# Patient Record
Sex: Female | Born: 1935 | Race: White | Hispanic: No | Smoking: Never smoker
Health system: Southern US, Community
[De-identification: ages and names within clinical notes are randomized; demographics above are authoritative.]

## PROBLEM LIST (undated history)

## (undated) DIAGNOSIS — I2510 Atherosclerotic heart disease of native coronary artery without angina pectoris: Secondary | ICD-10-CM

## (undated) DIAGNOSIS — R002 Palpitations: Secondary | ICD-10-CM

## (undated) DIAGNOSIS — E785 Hyperlipidemia, unspecified: Secondary | ICD-10-CM

## (undated) HISTORY — DX: Palpitations: R00.2

## (undated) HISTORY — PX: TONSILLECTOMY: SUR1361

## (undated) HISTORY — PX: VESICOVAGINAL FISTULA CLOSURE W/ TAH: SUR271

## (undated) HISTORY — PX: LITHOTRIPSY: SUR834

## (undated) HISTORY — DX: Hyperlipidemia, unspecified: E78.5

## (undated) HISTORY — DX: Atherosclerotic heart disease of native coronary artery without angina pectoris: I25.10

---

## 2004-10-28 ENCOUNTER — Ambulatory Visit: Payer: Self-pay | Admitting: Cardiology

## 2004-11-07 ENCOUNTER — Ambulatory Visit: Payer: Self-pay

## 2004-11-07 ENCOUNTER — Ambulatory Visit: Payer: Self-pay | Admitting: Cardiology

## 2004-11-28 ENCOUNTER — Ambulatory Visit: Payer: Self-pay | Admitting: Cardiology

## 2005-01-15 ENCOUNTER — Ambulatory Visit: Payer: Self-pay | Admitting: Cardiology

## 2005-06-10 ENCOUNTER — Ambulatory Visit: Payer: Self-pay | Admitting: Cardiology

## 2005-11-19 ENCOUNTER — Ambulatory Visit: Payer: Self-pay | Admitting: Cardiology

## 2006-04-06 ENCOUNTER — Ambulatory Visit: Payer: Self-pay | Admitting: Ophthalmology

## 2006-04-13 ENCOUNTER — Ambulatory Visit: Payer: Self-pay | Admitting: Ophthalmology

## 2006-06-03 ENCOUNTER — Inpatient Hospital Stay: Payer: Self-pay | Admitting: Urology

## 2006-06-24 ENCOUNTER — Ambulatory Visit: Payer: Self-pay | Admitting: Cardiology

## 2006-06-24 LAB — CONVERTED CEMR LAB
AST: 19 units/L (ref 0–37)
Albumin: 3.9 g/dL (ref 3.5–5.2)
Bilirubin, Direct: 0.1 mg/dL (ref 0.0–0.3)
LDL Cholesterol: 73 mg/dL (ref 0–99)
Total Bilirubin: 1 mg/dL (ref 0.3–1.2)
Total Protein: 6.5 g/dL (ref 6.0–8.3)
VLDL: 23 mg/dL (ref 0–40)

## 2006-07-06 ENCOUNTER — Observation Stay (HOSPITAL_COMMUNITY): Admission: EM | Admit: 2006-07-06 | Discharge: 2006-07-07 | Payer: Self-pay | Admitting: Emergency Medicine

## 2006-07-06 ENCOUNTER — Ambulatory Visit: Payer: Self-pay | Admitting: Cardiovascular Disease

## 2006-07-07 ENCOUNTER — Ambulatory Visit: Payer: Self-pay

## 2006-07-09 ENCOUNTER — Ambulatory Visit: Payer: Self-pay | Admitting: Urology

## 2006-07-16 ENCOUNTER — Ambulatory Visit: Payer: Self-pay | Admitting: Urology

## 2006-10-20 ENCOUNTER — Ambulatory Visit: Payer: Self-pay

## 2006-11-23 ENCOUNTER — Ambulatory Visit: Payer: Self-pay | Admitting: Cardiology

## 2007-08-02 ENCOUNTER — Ambulatory Visit: Payer: Self-pay | Admitting: Cardiology

## 2007-08-02 LAB — CONVERTED CEMR LAB
AST: 17 units/L (ref 0–37)
Albumin: 4.5 g/dL (ref 3.5–5.2)
Alkaline Phosphatase: 54 units/L (ref 39–117)
Bilirubin, Direct: 0.1 mg/dL (ref 0.0–0.3)
Total Bilirubin: 0.6 mg/dL (ref 0.3–1.2)
Triglycerides: 139 mg/dL (ref <150)
VLDL: 28 mg/dL (ref 0–40)

## 2007-08-03 ENCOUNTER — Ambulatory Visit: Payer: Self-pay | Admitting: Cardiology

## 2007-09-29 ENCOUNTER — Ambulatory Visit: Payer: Self-pay | Admitting: Unknown Physician Specialty

## 2007-10-13 ENCOUNTER — Ambulatory Visit: Payer: Self-pay

## 2007-11-08 ENCOUNTER — Ambulatory Visit: Payer: Self-pay | Admitting: Cardiology

## 2008-08-29 ENCOUNTER — Ambulatory Visit: Payer: Self-pay | Admitting: Cardiology

## 2008-10-05 IMAGING — CT CT STONE STUDY
1 of 2 series · 15 of 32 positions shown, 19 images · non-contrast
Comparison: none

REASON FOR EXAM: abdominal pain, rm 7
COMMENTS:

[Series 2: stone · axial · 0.61mm/px · z∈[-430,-52]mm · 15 of 143 slices shown, 19 images]
[im 11/143  soft-tissue]
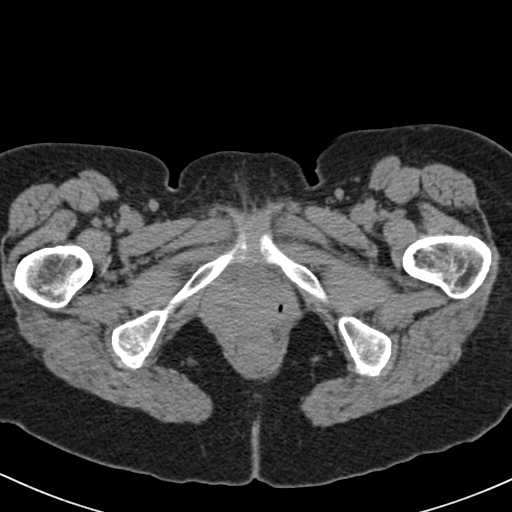
[im 11/143  bone]
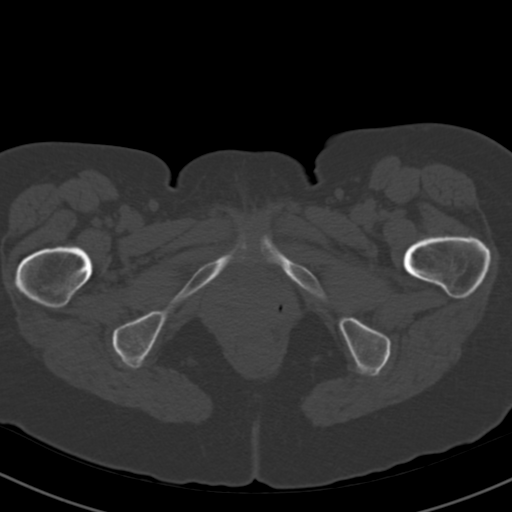
[im 21/143  soft-tissue]
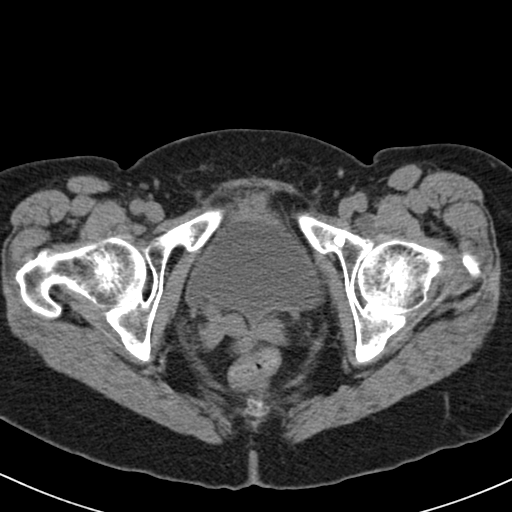
[im 31/143  soft-tissue]
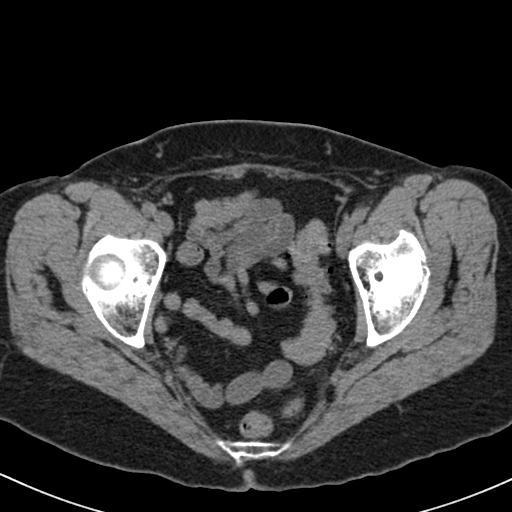
[im 41/143  soft-tissue]
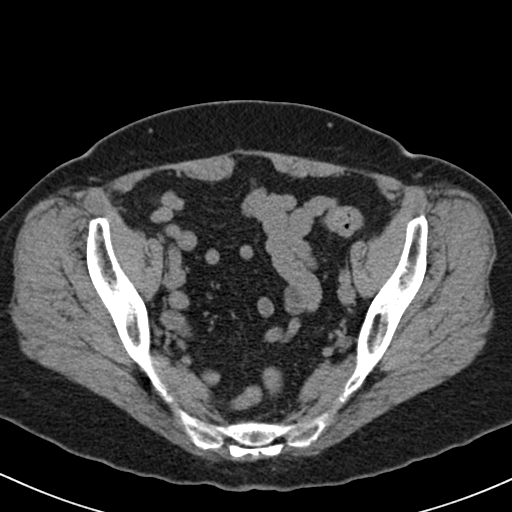
[im 51/143  soft-tissue]
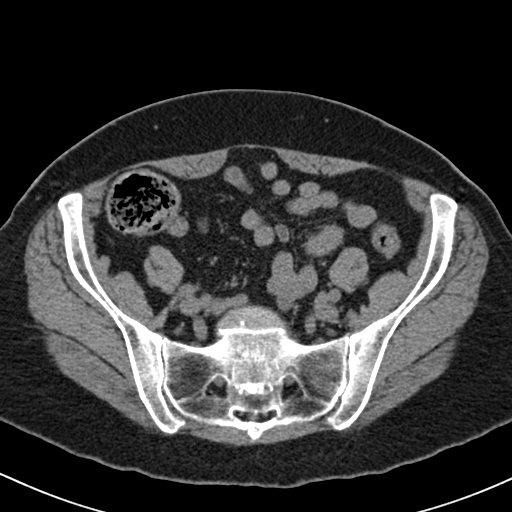
[im 61/143  soft-tissue]
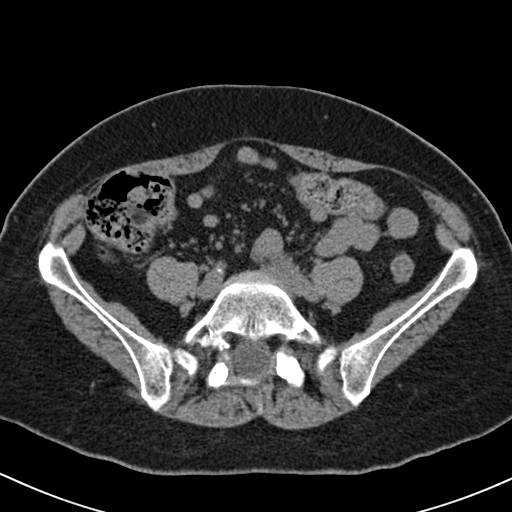
[im 72/143  soft-tissue]
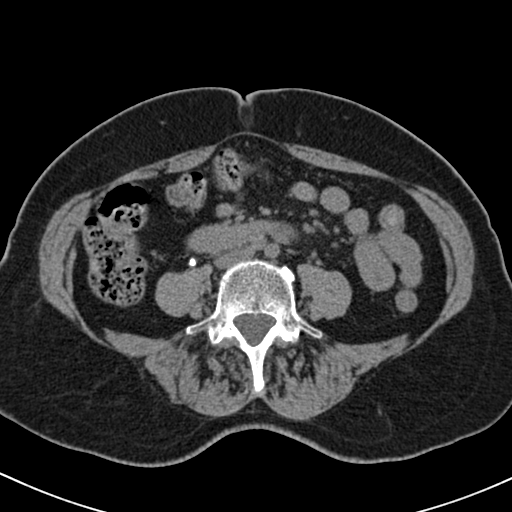
[im 82/143  soft-tissue]
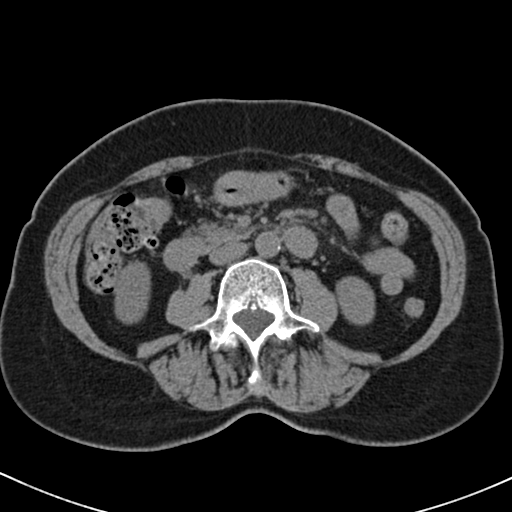
[im 92/143  soft-tissue]
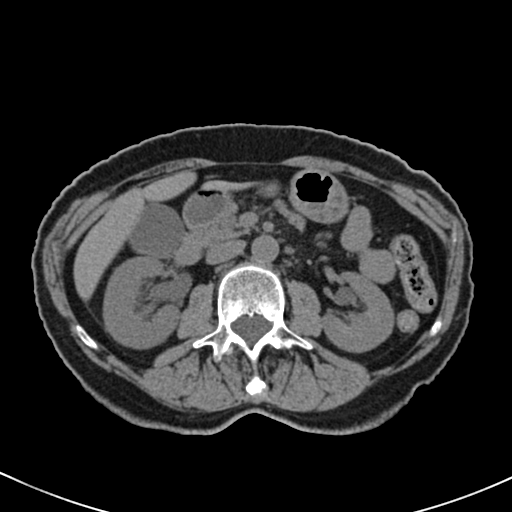
[im 92/143  bone]
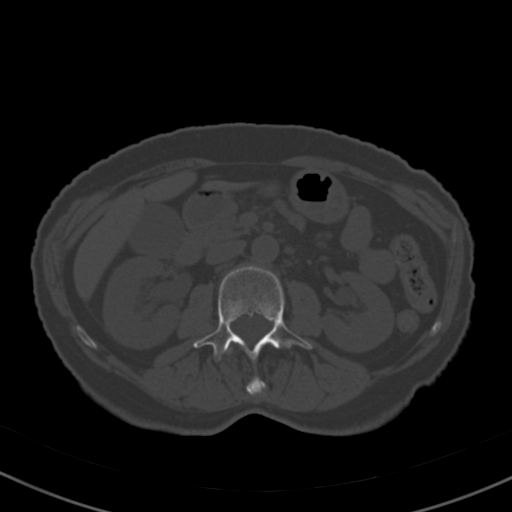
[im 102/143  soft-tissue]
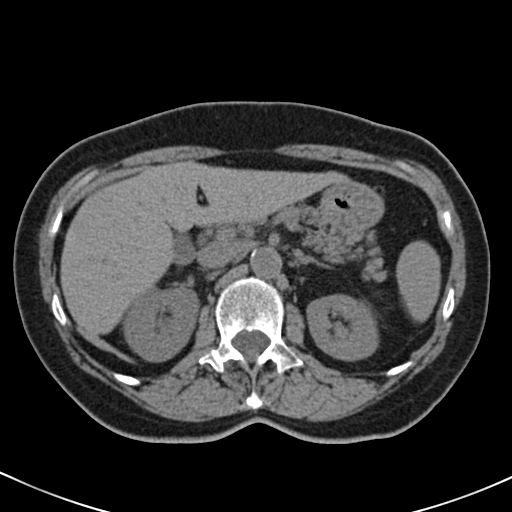
[im 112/143  soft-tissue]
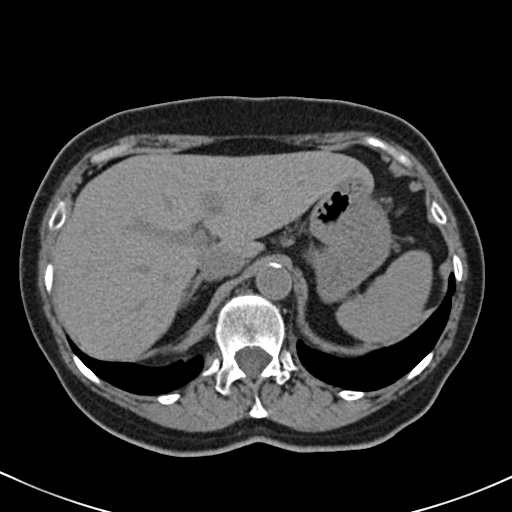
[im 122/143  soft-tissue]
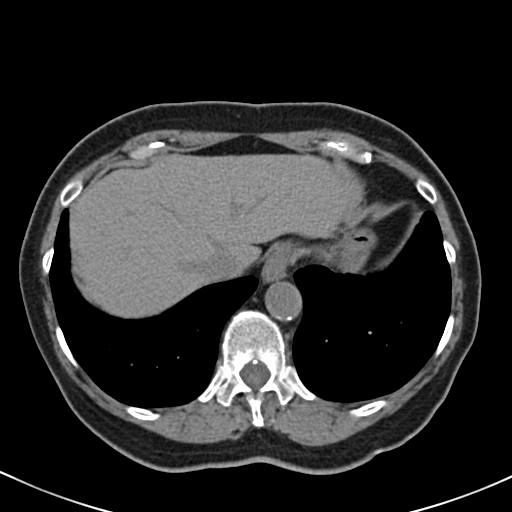
[im 122/143  lung]
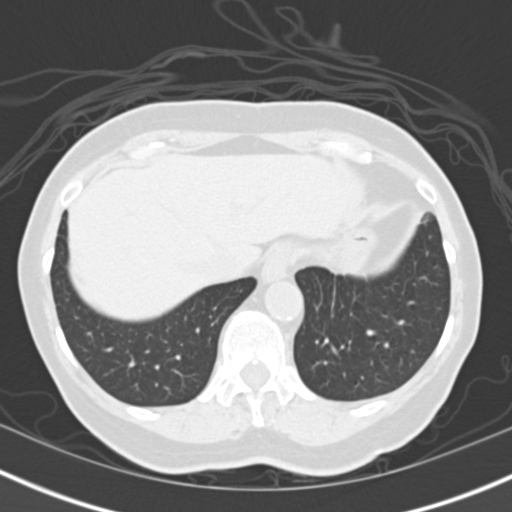
[im 127/143  lung]
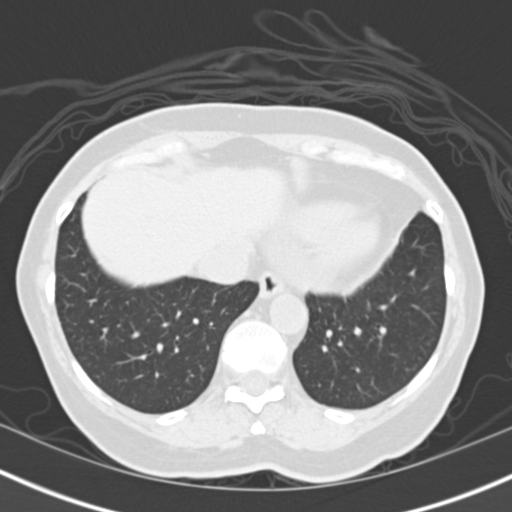
[im 132/143  soft-tissue]
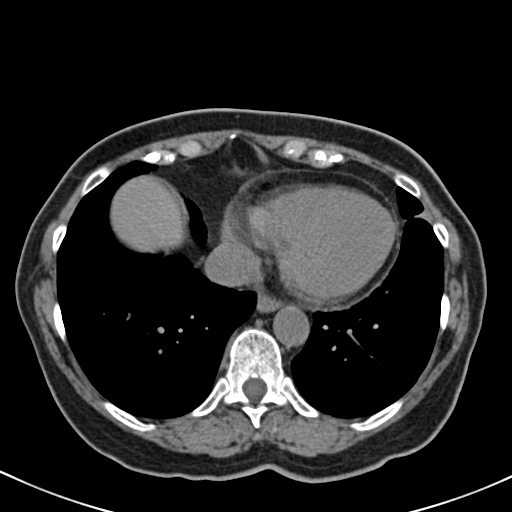
[im 132/143  lung]
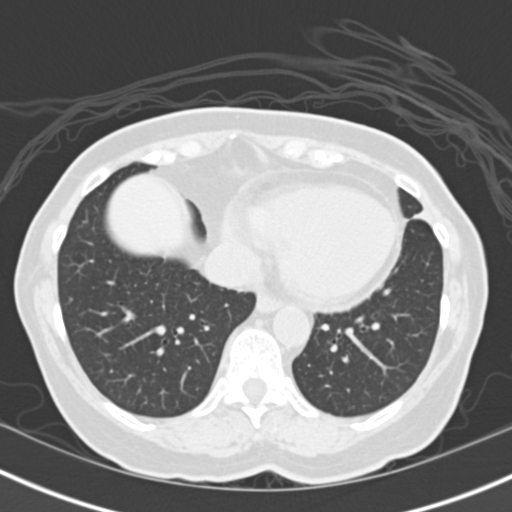
[im 137/143  lung]
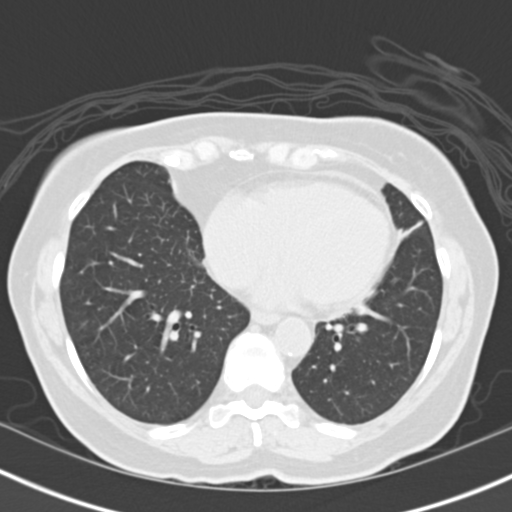

[15 of 32 positions shown; findings below may reference images not displayed]

PROCEDURE:     CT  - CT ABDOMEN /PELVIS WO (STONE)  - June 03, 2006  [DATE]

RESULT:     The patient is complaining of nausea and RIGHT flank discomfort.

There is mild hydronephrosis and hydroureter on the RIGHT secondary to an
approximately 4 mm diameter stone in region of the junction of the proximal
middle thirds of the RIGHT ureter. There is no evidence of a urinary bladder
stone.  There is an additional approximately 4 mm diameter stone in a lower
pole calyx on the RIGHT and sub mm stones are seen in other calices on the
RIGHT. On the LEFT no stone is identified though faint calcification is seen
in the medullary portions of the kidney. The perinephric fat planes are
preserved.  The caliber of the abdominal aorta is normal.  The unopacified
loops of small and large bowel exhibit no acute abnormality.  The liver is
normal in density with no evidence of intrahepatic ductal dilation.  The
gallbladder is adequately distended with no evidence of calcified stones.
The spleen, stomach, and adrenal glands and pancreas exhibit no acute
abnormality.  Within the pelvis the uterus is apparently surgically absent.
I see no adnexal masses nor free fluid.  A few sigmoid diverticula are
evident, but no diverticulitis type changes are seen. The lung bases exhibit
no acute abnormality.
IMPRESSION: 1)There is mild hydronephrosis and hydroureter on the RIGHT secondary to an
approximately 4 to 5 mm diameter stone in the proximal to mid RIGHT ureter.
Other non-obstructing stones are noted on the RIGHT.  I see no definite
stones on the LEFT.

2)I see no acute abnormality elsewhere within the abdomen or pelvis.

A preliminary report was sent to the Emergency Room at the conclusion of the
study.

## 2008-11-14 ENCOUNTER — Emergency Department: Payer: Self-pay | Admitting: Emergency Medicine

## 2008-12-20 ENCOUNTER — Encounter (INDEPENDENT_AMBULATORY_CARE_PROVIDER_SITE_OTHER): Payer: Self-pay | Admitting: *Deleted

## 2009-01-23 ENCOUNTER — Emergency Department: Payer: Self-pay | Admitting: Emergency Medicine

## 2009-01-23 ENCOUNTER — Encounter: Payer: Self-pay | Admitting: Cardiology

## 2009-01-23 ENCOUNTER — Telehealth: Payer: Self-pay | Admitting: Cardiology

## 2009-01-23 DIAGNOSIS — R0789 Other chest pain: Secondary | ICD-10-CM

## 2009-02-05 ENCOUNTER — Telehealth (INDEPENDENT_AMBULATORY_CARE_PROVIDER_SITE_OTHER): Payer: Self-pay | Admitting: *Deleted

## 2009-02-06 ENCOUNTER — Encounter: Payer: Self-pay | Admitting: Cardiology

## 2009-02-06 ENCOUNTER — Ambulatory Visit: Payer: Self-pay

## 2009-04-10 ENCOUNTER — Ambulatory Visit: Payer: Self-pay | Admitting: Cardiology

## 2009-04-10 DIAGNOSIS — I2510 Atherosclerotic heart disease of native coronary artery without angina pectoris: Secondary | ICD-10-CM | POA: Insufficient documentation

## 2009-04-10 DIAGNOSIS — E782 Mixed hyperlipidemia: Secondary | ICD-10-CM

## 2009-04-17 ENCOUNTER — Ambulatory Visit: Payer: Self-pay | Admitting: Cardiology

## 2009-04-18 ENCOUNTER — Encounter: Payer: Self-pay | Admitting: Cardiology

## 2009-04-20 LAB — CONVERTED CEMR LAB
Cholesterol: 169 mg/dL (ref 0–200)
HDL: 79 mg/dL (ref >39)
Indirect Bilirubin: 0.5 mg/dL (ref 0.0–0.9)
LDL Cholesterol: 68 mg/dL (ref 0–99)
Total Bilirubin: 0.7 mg/dL (ref 0.3–1.2)
Total CHOL/HDL Ratio: 2.1
Total Protein: 7.2 g/dL (ref 6.0–8.3)
Triglycerides: 111 mg/dL (ref <150)
VLDL: 22 mg/dL (ref 0–40)

## 2010-09-24 NOTE — Assessment & Plan Note (Signed)
[facility] OFFICE NOTE   Cassie Harrington, Cassie Harrington                       MRN:          [id number]  DATE:11/08/2007                            DOB:          1930-01-29    Cassie Harrington returns today for further management of the following issues:  1. Dyspnea on exertion, which has improved.  She is very active      working in the yard.  2. Hyperlipidemia under excellent control.  3. Nonobstructive coronary artery disease.  4. Tortuous or serpentine carotids.  She has no significant      obstructive disease.   She is having no chest pain, angina, dyspnea on exertion, or symptoms of  TIAs.   Recent carotid Dopplers showed no significant plaque with redundant  tortuous internal carotid arteries.  She has antegrade flow in both  vertebras.  Study date October 13, 2007.   CURRENT MEDICATIONS:  1. Synthroid 0.075 mg per day.  2. Toprol-XL 25 mg a day.  3. Aspirin 81 mg, she says p.r.n.  4. Crestor 10 mg nightly.  5. Tylenol 2 nightly.   PHYSICAL EXAMINATION:  GENERAL:  She looks much younger than her stated  age.  VITAL SIGNS:  Her blood pressure is 135/63, her pulse is 71 and regular,  and weight is 132, which is down from 136.  Her respiratory exam is  unchanged.   ASSESSMENT/PLAN:  Cassie Harrington is doing well.  We have reviewed her carotid  Dopplers and answered all questions.  I will plan on seeing her back in  March 2010.     Thomas C. Daleen Squibb, MD, [facility]  Electronically Signed    TCW/MedQ  DD: 11/08/2007  DT: 11/09/2007  Job #: [id number]   cc:   Harlow Asa. Doroteo Glassman, MD

## 2010-09-24 NOTE — Assessment & Plan Note (Signed)
[facility] OFFICE NOTE   Cassie Harrington, Cassie Harrington                       MRN:          [id number]  DATE:08/03/2007                            DOB:          [birth year]    HISTORY OF PRESENT ILLNESS:  Cassie Harrington comes in today because of some  shortness of breath and chest discomfort.   She came down with the flu about 6 weeks ago.  She still has some  residual cough and congestion.  She had a chest x-ray which showed no  pneumonia.   She just had generalized weakness and fatigue and some shortness of  breath getting around.  She has had no true angina that I can gather.  She has a history of nonobstructive disease and hyperlipidemia.   CURRENT MEDICATIONS:  1. Toprol 25 mg.  2. Synthroid 0.075 mg q.h.s.  3. Aspirin 81 mg daily.  4. Crestor 10 mg q.h.s..   REVIEW OF SYSTEMS:  She denies orthopnea, PND or peripheral edema.  She  has had no hemoptysis.   PHYSICAL EXAMINATION:  VITAL SIGNS:  Current blood pressure 138/70,  pulse 63 and regular, weight is 136 (down 3).  HEENT:  Normocephalic, atraumatic.  PERRL.  Extraocular is intact.  Sclerae are clear, nonicteric.  Facial symmetry is normal.  Dentition  satisfactory.  She looks younger than stated age.  Skin is warm and dry.  NECK:  Supple.  There is no lymphadenopathy.  Carotids were equal  bilateral with soft systolic sounds bilaterally.  Thyroid is not  enlarged.  Trachea is midline.  LUNGS:  Clear without inspiratory-expiratory rhonchi.  CARDIAC:  Exam reveals a normal S1-S2.  No rub.  ABDOMEN:  Soft, good bowel sounds.  EXTREMITIES:  No sinus clubbing or edema.  Pulses are intact.  No sign  of DVT.  NEUROLOGICAL:  Intact.   STUDIES:  EKG shows sinus rhythm with poor R-wave progression across  anterior precordium which is unchanged.   Her lipids were reviewed and are at goal.  LFTs were normal.   ASSESSMENT/PLAN:  1. Dyspnea on exertion which is  probably related to residual influenza      and upper respiratory tract infection.  2. Hyperlipidemia under excellent control.  3. Nonobstructive CAD.  4. Serpentine carotids.  No significant obstructive disease.   RECOMMENDATIONS:  I have asked Ms. Lothrop to call me if her dyspnea on  exertion is not improved in the next several weeks.  We could always do  another stress test if necessary.  She will continue with her current  medications.  I will plan on seeing her back in a year.  She is due  carotid Dopplers again in June 2009.     Thomas C. Daleen Squibb, MD, [facility]  Electronically Signed    TCW/MedQ  DD: 08/03/2007  DT: 08/03/2007  Job #: [id number]   cc:   [facility] Dr. Clent Jacks

## 2010-09-24 NOTE — Assessment & Plan Note (Signed)
[facility] OFFICE NOTE   Cassie, MCARTHY                       MRN:          [id number]  DATE:11/23/2006                            DOB:          March 30, 1930    Cassie Harrington returns today for further management of her hypertension and  hyperlipidemia.   She was admitted to [facility] at the end of February for chest  discomfort and palpitations.  She ruled out for myocardial infarction.  Stress Myoview, July 07, 2006, EF 80%, no ischemia.   She has been checking her blood pressure with a digital cuff and it has  been under good control.  In addition, her lipids were at goal on  Crestor at 10 mg a day in February.   Her only cardiac meds at this point are Toprol XL 25 mg a day, enteric  coated aspirin 81 mg a day, Crestor 10 mg a day.  She is on her  Synthroid and her TSH was normal in February.   PHYSICAL EXAMINATION:  VITAL SIGNS:  Her blood pressure today is 118/72,  pulse 63 and regular, weight is 139.  HEENT:  Unchanged.  Carotids are full.  No bruits.  Thyroid is not  enlarged.  Trachea is midline.  LUNGS:  Clear.  HEART:  Reveals a soft S1 S2.  ABDOMEN:  Soft.  Good bowel sounds.  No midline bruit.  EXTREMITIES:  With no edema.  Pulses are brisk.   Carotid Doppler, June 2008, showed serpentine cartoids without any  significant stenosis.  She has antegrade flow in both vertebrals.   EKG is remarkable for poor R wave progression across the anterior  precordium which is stable, otherwise normal.   Cassie Harrington is doing well.  I am delighted with her blood pressure as well  as her lipids.  We will follow up with her in June 2009.  At that time,  she will need carotid Dopplers.     Thomas C. Daleen Squibb, MD, [facility]  Electronically Signed    TCW/MedQ  DD: 11/23/2006  DT: 11/23/2006  Job #: [id number]   cc:   Clent Jacks, MD at [facility]  Azucena Cecil, MD at [facility]

## 2010-09-24 NOTE — Assessment & Plan Note (Signed)
[facility] OFFICE NOTE   Cassie, Harrington                       MRN:          [id number]  DATE:08/29/2008                            DOB:          [birth year]    Ms. Cassie Harrington comes in today for followup.  She is having no symptoms of  angina or ischemia.   Her blood work has been followed by Dr. Dorma Russell at [facility].   She looks remarkably young for her age.  This is true particularly  considering she just had colon cancer diagnosed and had a partial right  hemicolectomy with total cure at this point.   She has a history of carotid bruits, but has normal yet tortuous  carotids by ultrasound.   MEDICATIONS:  1. Synthroid 0.075 mg per day.  2. Toprol-XL 25 mg a day.  3. Aspirin 81 mg p.r.n.  4. Crestor 10 mg nightly.   PHYSICAL EXAMINATION:  VITAL SIGNS:  Her blood pressure today is 114/62,  her pulse is 63 and regular, and her weight is 134.  HEENT:  Normal.  Dentition is in great shape.  NECK:  Supple.  Carotid upstrokes were equal bilaterally with soft faint  systolic sounds bilaterally.  Thyroid is not enlarged.  Trachea is  midline.  CHEST:  Lungs are clear to auscultation and percussion.  HEART:  A nondisplaced PMI.  Normal S1 and S2.  ABDOMEN:  Soft with good bowel sounds.  No midline bruit.  No  hepatomegaly.  EXTREMITIES:  No cyanosis, clubbing, or edema.  Pulses are intact.  NEURO:  Intact.   Her electrocardiogram is normal except for poor progression in the  anterior precordium, which is stable.   ASSESSMENT AND PLAN:  Cassie Harrington is doing well.  I have made no changes in  the medical program.  We will see her back in 6 months.     Thomas C. Daleen Squibb, MD, [facility]  Electronically Signed    TCW/MedQ  DD: 08/29/2008  DT: 08/30/2008  Job #: [id number]

## 2010-09-27 NOTE — Discharge Summary (Signed)
NAME:  ROSHAUNDA, STARKEY                ACCOUNT NO.:  [id number]   MEDICAL RECORD NO.:  [id number]          PATIENT TYPE:  OBV   LOCATION:  3707                         FACILITY:  [facility]   PHYSICIAN:  Veverly Fells. Excell Seltzer, MD  DATE OF BIRTH:  12/14/1929   DATE OF ADMISSION:  07/06/2006  DATE OF DISCHARGE:  07/07/2006                               DISCHARGE SUMMARY   PROCEDURES:  None.   DISCHARGE DIAGNOSIS:  Chest pain.   SECONDARY DIAGNOSES:  1. History of tachy palpitations.  2. Hyperlipidemia.  3. Family history of coronary artery disease.  4. Recent history of nephrolithiasis.  5. Status post carotid Dopplers with nonobstructive plaque in 2006.  6. Status post hysterectomy, tonsillectomy, lithotripsy.  7  Allergy or intolerance to SULFA, CELEBREX and PREDNISONE.   HOSPITAL COURSE:  Ms. Berhe is a 77 year old female with no previous  history of coronary artery disease.  She had a headache and general  malaise after waking up from a nap on the day prior to admission and  also had some chest pressure that she describes as being worse with deep  inspiration.  Her symptoms did not resolve, and she had some  paresthesias overnight in her upper extremities, so she came to the  emergency room and was admitted for further evaluation and treatment.   Her initial cardiac enzymes were normal.  A TSH and lipid profile are  pending at the time of dictation.  Dr. Excell Seltzer has evaluated Ms. Mechling and  feels that if her cardiac enzymes remain negative, she is tentatively  considered stable for discharge on July 07, 2006, with an outpatient  Myoview and followup arranged.   DISCHARGE INSTRUCTIONS:  1. Her activity level is to be increased gradually.  2. She is to stick to a low-sodium diet.  3. She is to follow up with Dr. Daleen Squibb on March 7 at 10 a.m.  4. She is to get a stress test on February 26 at 10:00 a.m. at [facility]      in [place].  5. Followup with Dr. Daleen Squibb is to be in [place].   DISCHARGE MEDICATIONS:  1. Crestor 10 mg a day.  2. Synthroid 75 mcg daily.  3. Toprol XL 25 mg a day.  4. Aspirin 81 mg a day.      Theodore Demark, PA-C      Veverly Fells. Excell Seltzer, MD  Electronically Signed    RB/MEDQ  D:  07/06/2006  T:  07/06/2006  Job:  [id number]   cc:   Dr. Yates Decamp, [place], [place]

## 2010-09-27 NOTE — H&P (Signed)
NAME:  Cassie Harrington, Cassie Harrington                ACCOUNT NO.:  [id number]   MEDICAL RECORD NO.:  [id number]          PATIENT TYPE:  OBV   LOCATION:  3707                         FACILITY:  [facility]   PHYSICIAN:  Veverly Fells. Excell Seltzer, MD  DATE OF BIRTH:  1929/08/08   DATE OF ADMISSION:  07/06/2006  DATE OF DISCHARGE:                              HISTORY & PHYSICAL   PRIMARY CARE PHYSICIAN:  Dr. Yates Decamp in [place].   PRIMARY CARDIOLOGIST:  Jesse Sans. Wall, MD, [facility]   CHIEF COMPLAINT:  Chest pain.   HISTORY OF PRESENT ILLNESS:  Cassie Harrington is a 77 year old female with no  known history of coronary artery disease.  Yesterday, she woke up with a  right-sided headache after a nap during the day.  She also complains of  general malaise.  She had some chest pressure that was worse with deep  inspiration and is described as a heaviness.  Last night, she had  paresthesias in both upper extremities, right greater than left, which  woke her several times.  This a.m., her symptoms had not improved so she  came to the emergency room.  She tried aspirin, but no other medical  therapy.  She denies shortness of breath, nausea, vomiting or  diaphoresis.  Her chest pain was a 5-6/10 at first and reached an 8/10  with deep inspiration.  She had an evaluation in 2006 which included a  Myoview that was without ischemia.  She is currently symptom free.   PAST MEDICAL HISTORY:  1. Tachypalpitations, minimized with low dose beta blocker.  2. Hyperlipidemia.  3. Family history of coronary artery disease.  4. History of nephrolithiasis 2-3 weeks ago requiring hospitalization      and lithotripsy.  5. Status post carotid Dopplers in 2006 that showed nonobstructive      plaque.   SURGICAL HISTORY:  She is status post hysterectomy, tonsillectomy and  lithotripsy.   ALLERGIES:  She is intolerant to SULFA, CELEBREX and PREDNISONE which  all cause significant nausea.   CURRENT MEDICATIONS:  1. Crestor 10 mg a day.  2.  Synthroid 75 mcg daily.  3. Toprol XL 25 mg a day.  4. Aspirin 81 mg a day.   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  She has no history of alcohol, tobacco or drug abuse.  She  does not abuse caffeine.  She does not exercise regularly but is active  caring for her grandchildren, one of whom is 26 years old.   FAMILY HISTORY:  Her mother died at age 69.  Her father died at age 4.  Neither one had heart disease, but she had one brother who died of an MI  at age 55.   REVIEW OF SYSTEMS:  She has some vision loss.  The chest pain is  described above.  She has had a slight cough since the lithotripsy.  She  denies hematemesis, hemoptysis or melena.  She has not had any hematuria  or back pain since the lithotripsy.  Review of systems is otherwise  negative.  PHYSICAL EXAMINATION:  VITAL SIGNS:  Temperature is 98.4, blood pressure  127/70, pulse 60, respiratory rate 16, O2 saturation 99% on room air.  GENERAL:  She is a well-developed, well-nourished white female in no  acute distress.  HEENT:  Head is normocephalic and atraumatic with extraocular movements  intact.  Sclera clear.  Nares without discharge.  NECK:  There is no lymphadenopathy, thyromegaly, bruit or JVD noted.  CV:  Heart is regular in rate and rhythm with an S1-S2 and no  significant murmur, rub or gallop is noted.  LUNGS:  Essentially clear to auscultation bilaterally with a few rales  in the right base.  SKIN:  No rashes or lesions are noted.  ABDOMEN:  Soft and nontender with active bowel sounds and no  hepatosplenomegaly.  EXTREMITIES:  There is no cyanosis, clubbing or edema, and no rash,  lesions or petechiae are noted.  MUSCULOSKELETAL:  She has got no joint deformity or effusions and no  spine or CVA tenderness.  NEUROLOGIC:  She is alert and oriented.  Cranial nerves II-XII grossly  intact.   CHEST X-RAY:  No acute disease.   LABORATORY VALUES:  Hemoglobin 12.7,  hematocrit 38.1, WBCs 5.9,  platelets 207, D-dimer 0.22.  Sodium 139, potassium 4.8, chloride 108,  BUN 10, creatinine 0.9, glucose 103.  Point of care markers negative x1,  and CK-MB and troponin I negative x1.   IMPRESSION:  Mr. Kea is a 77 year old female with atypical chest pain  and negative objective markers for ischemia.  She is admitted to the  hospital and myocardial infarction will be ruled out.  If her cardiac  enzymes are negative for myocardial infarction, then an outpatient  Myoview has been set up in the office tomorrow.  She will be continued  on her home medications and aspirin, but no further medical therapy is  indicated at this time, as she is currently symptom free.      Theodore Demark, PA-C      Veverly Fells. Excell Seltzer, MD  Electronically Signed    RB/MEDQ  D:  07/06/2006  T:  07/06/2006  Job:  [id number]   cc:   Yates Decamp, M.D.

## 2010-09-27 NOTE — Assessment & Plan Note (Signed)
[facility]                              CARDIOLOGY OFFICE NOTE   AINA, ROSSBACH                       MRN:          [id number]  DATE:11/19/2005                            DOB:          Feb 17, 1930    Ms. Korzeniewski returns for management of the following issues:  1.  Exertional chest pain with a negative stress Myoview November 07, 2004,      EF75%.  2.  Palpitations - well controlled on Toprol.  3.  Mixed hyperlipidemia with lipids at goal on Crestor 10 mg a day January      2007.  4.  Cerebrovascular disease with non-obstructive plaque by carotid Dopplers      June 2006.   She is doing well.  She has no complaints.  She is walking a lot with no  symptoms of ischemia or claudication.   Her medications are:  1.  Crestor 10 mg a day.  2.  Toprol XL 25 mg a day.  3.  Synthroid 0.075 nightly.  She is not taking baby aspirin every day.   VITAL SIGNS:  Blood pressure is 151/76.  I rechecked it after she had rested  for a few minutes, it was 140/71.  Pulse is 57 and regular.  EKG is stable.  NECK:  Carotid upstrokes reveal a soft carotid bruit on the right. Thyroid  is not enlarged.  Trachea is midline.  LUNGS:  Clear.  HEART:  Reveals a regular rate and rhythm.  ABDOMEN:  Soft.  No midline bruit.  There is no hepatomegaly.  EXTREMITIES:  No cyanosis, clubbing or edema.  Pulses are brisk.  NEURO:  Intact.   ASSESSMENT AND PLAN:  Ms. Hoelzel is doing well.  I am concerned about her  blood pressure.  I have asked her to get a digital cuff and to call us if  her blood pressure is running consistently above 130/80.  She needs blood  work for her lipids in January of 2008.  She will need carotid Dopplers when  she returns to see [place] in a year.  I have asked her to take a baby aspirin  every day.                               Thomas C. Daleen Squibb, MD, [facility]    TCW/MedQ  DD:  11/19/2005  DT:  11/19/2005  Job #:  [id number]   cc:   Dr. Dewayne Hatch [place]

## 2010-10-29 ENCOUNTER — Encounter: Payer: Self-pay | Admitting: Cardiology

## 2010-11-21 ENCOUNTER — Encounter: Payer: Self-pay | Admitting: Cardiology

## 2010-11-21 ENCOUNTER — Ambulatory Visit (INDEPENDENT_AMBULATORY_CARE_PROVIDER_SITE_OTHER): Payer: Medicare PPO | Admitting: Cardiology

## 2010-11-21 VITALS — BP 120/72 | HR 59 | Ht 64.0 in | Wt 146.0 lb

## 2010-11-21 DIAGNOSIS — I2510 Atherosclerotic heart disease of native coronary artery without angina pectoris: Secondary | ICD-10-CM

## 2010-11-21 MED ORDER — ASPIRIN 81 MG PO TBEC: 81.0000 mg | DELAYED_RELEASE_TABLET | Freq: Every day | ORAL | Status: DC

## 2010-11-21 NOTE — Assessment & Plan Note (Signed)
Stable. Continue medical therapy. Have advised her to be on aspirin 81 mg a day. Followup in one year.

## 2010-11-21 NOTE — Progress Notes (Signed)
HPI Cassie Harrington comes in today for evaluation and management of her coronary artery disease.  She did remarkably well with no symptoms of angina or ischemia.  Her blood work is being followed by Dr. Dareen Piano her primary care physician. She remains on a statin as well as metoprolol.  Her EKG today shows sinus bradycardia with left axis deviation with no changes. She has poor R.  Wave progression in the anterior precordium. Past Medical History  Diagnosis Date  . Palpitations     Tachy, minimized with low does beta blocker  . Hyperlipidemia   . CAD (coronary artery disease)     Family hx    Past Surgical History  Procedure Date  . Vesicovaginal fistula closure w/ tah   . Tonsillectomy   . Lithotripsy     Family History  Problem Relation Age of Onset  . Heart attack Brother 40    MI    History   Social History  . Marital Status: Married    Spouse Name: N/A    Number of Children: N/A  . Years of Education: N/A   Occupational History  . Housewife    Social History Main Topics  . Smoking status: Never Smoker   . Smokeless tobacco: Not on file  . Alcohol Use: No  . Drug Use: No  . Sexually Active: Not on file   Other Topics Concern  . Not on file   Social History Narrative   [place] in [place] with husbandDoes not abuse caffeineDoes not exercise regularly but is active caring for her grandchildren, one of whom is 41 years old    Allergies  Allergen Reactions  . Prednisone     REACTION: Reaction not known  . Sulfonamide Derivatives     REACTION: Reaction not known    Current Outpatient Prescriptions  Medication Sig Dispense Refill  . levothyroxine (SYNTHROID, LEVOTHROID) 75 MCG tablet Take 75 mcg by mouth daily.        . metoprolol succinate (TOPROL-XL) 25 MG 24 hr tablet Take 25 mg by mouth daily.        . rosuvastatin (CRESTOR) 10 MG tablet Take 10 mg by mouth at bedtime.          ROS Negative other than HPI.   PE General Appearance: well developed,  well nourished in no acute distress HEENT: symmetrical face, PERRLA, good dentition  Neck: no JVD, thyromegaly, or adenopathy, trachea midline Chest: symmetric without deformity Cardiac: PMI non-displaced, RRR, normal S1, S2, no gallop or murmur Lung: clear to ausculation and percussion Vascular: all pulses full without bruits  Abdominal: nondistended, nontender, good bowel sounds, no HSM, no bruits Extremities: no cyanosis, clubbing or edema, no sign of DVT, no varicosities  Skin: normal color, no rashes Neuro: alert and oriented x 3, non-focal Pysch: normal affect Filed Vitals:   11/21/10 1010 11/21/10 1037  BP: 147/71 120/72  Pulse: 59   Height: 5\' 4"  (1.626 m)   Weight: 146 lb (66.225 kg)     EKG  Labs and Studies Reviewed.   No results found for this basename: WBC, HGB, HCT, MCV, PLT      Chemistry   No results found for this basename: NA, K, CL, CO2, BUN, CREATININE, GLU      Component Value Date/Time   ALKPHOS 53 04/18/2009 0338   AST 21 04/18/2009 0338   ALT 15 04/18/2009 0338   BILITOT 0.7 04/18/2009 0338       Lab Results  Component Value Date  CHOL 169 04/18/2009   CHOL 156 08/02/2007   CHOL 164 06/24/2006   Lab Results  Component Value Date   HDL 79 04/18/2009   HDL 69 1/61/0960   HDL 68.3 06/24/2006   Lab Results  Component Value Date   LDLCALC 68 04/18/2009   LDLCALC 59 08/02/2007   LDLCALC 73 06/24/2006   Lab Results  Component Value Date   TRIG 111 04/18/2009   TRIG 139 08/02/2007   TRIG 113 06/24/2006   Lab Results  Component Value Date   CHOLHDL 2.1 Ratio 04/18/2009   CHOLHDL 2.3 Ratio 08/02/2007   CHOLHDL 2.4 CALC 06/24/2006   No results found for this basename: HGBA1C   Lab Results  Component Value Date   ALT 15 04/18/2009   AST 21 04/18/2009   ALKPHOS 53 04/18/2009   BILITOT 0.7 04/18/2009   No results found for this basename: TSH

## 2010-11-21 NOTE — Progress Notes (Signed)
Addended by: Mylo Red F on: 11/21/2010 10:54 AM   Modules accepted: Orders

## 2010-11-21 NOTE — Patient Instructions (Signed)
Your physician recommends that you schedule a follow-up appointment in: 1 year with Dr. Wall  

## 2010-11-22 ENCOUNTER — Telehealth: Payer: Self-pay | Admitting: *Deleted

## 2010-11-22 NOTE — Telephone Encounter (Signed)
Pt returned call and will restart 81mg  aspirin daily. Mylo Red RN

## 2010-11-22 NOTE — Telephone Encounter (Signed)
LMOVM to continue Aspirin 81mg  daily. Mylo Red RN

## 2010-11-22 NOTE — Telephone Encounter (Signed)
I have mailed information to pt since I have not been able to reach her by phone for 2 days. Continue taking an 81 mg coated aspirin everyday. Mylo Red RN

## 2010-11-22 NOTE — Telephone Encounter (Signed)
No answer on mobile. Home number disconnected Mylo Red RN

## 2011-03-04 ENCOUNTER — Other Ambulatory Visit: Payer: Self-pay | Admitting: Cardiology

## 2011-11-14 ENCOUNTER — Encounter: Payer: Self-pay | Admitting: *Deleted

## 2011-11-25 ENCOUNTER — Encounter: Payer: Self-pay | Admitting: Cardiology

## 2011-11-25 ENCOUNTER — Ambulatory Visit (INDEPENDENT_AMBULATORY_CARE_PROVIDER_SITE_OTHER): Payer: Medicare PPO | Admitting: Cardiology

## 2011-11-25 VITALS — BP 114/60 | HR 59 | Ht 64.0 in | Wt 144.0 lb

## 2011-11-25 DIAGNOSIS — I2510 Atherosclerotic heart disease of native coronary artery without angina pectoris: Secondary | ICD-10-CM

## 2011-11-25 DIAGNOSIS — R9431 Abnormal electrocardiogram [ECG] [EKG]: Secondary | ICD-10-CM | POA: Insufficient documentation

## 2011-11-25 DIAGNOSIS — R0602 Shortness of breath: Secondary | ICD-10-CM

## 2011-11-25 DIAGNOSIS — E782 Mixed hyperlipidemia: Secondary | ICD-10-CM

## 2011-11-25 NOTE — Patient Instructions (Addendum)
Your physician wants you to follow-up in: 1 year with Dr. Wall. You will receive a reminder letter in the mail two months in advance. If you don't receive a letter, please call our office to schedule the follow-up appointment.  

## 2011-11-25 NOTE — Assessment & Plan Note (Signed)
Stable by history and exam and EKG. Continue secondary preventative therapy.

## 2011-11-25 NOTE — Addendum Note (Signed)
Addended by: Waymon Budge on: 11/25/2011 12:47 PM   Modules accepted: Orders

## 2011-11-25 NOTE — Assessment & Plan Note (Signed)
I'm not sure exactly what this means. It could be lead reversal but to make sure we will do a echocardiogram to assess her lateral Cassie Harrington. Is possible she could have an out-of-hospital infarct.

## 2011-11-25 NOTE — Progress Notes (Signed)
HPI Cassie Harrington comes in today for evaluation and management of some shortness of breath and her history of coronary disease.  Over the last several nights, she awakes and feels a little bit breathless. It goes away after 2-3 deep breath. She denies any other associated symptoms including chest pain, nausea, vomiting, diaphoresis, hemoptysis. She denies orthopnea, PND or edema. It does not bother her with exertion.  She's had a lot of stress caring for her husband. They have moved to a retirement center. She's not quite as active as she used to be.  As I said, she denies any chest pain or palpitations.  Past Medical History  Diagnosis Date  . Palpitations     Tachy, minimized with low does beta blocker  . Hyperlipidemia   . CAD (coronary artery disease)     Family hx  . Rapid palpitations     Current Outpatient Prescriptions  Medication Sig Dispense Refill  . aspirin 81 MG EC tablet Take 1 tablet (81 mg total) by mouth at bedtime.      . Calcium Carbonate-Vitamin D (CALCIUM 600 + D PO) Take by mouth daily.      . cholecalciferol (VITAMIN D) 1000 UNITS tablet Take 1,000 Units by mouth daily.      . CRESTOR 10 MG tablet TAKE ONE TABLET AT BEDTIME  30 tablet  11  . levothyroxine (SYNTHROID, LEVOTHROID) 75 MCG tablet Take 75 mcg by mouth daily.        . metoprolol succinate (TOPROL-XL) 25 MG 24 hr tablet TAKE 1 TABLET EVERY DAY  30 tablet  11    Allergies  Allergen Reactions  . Prednisone     REACTION: Reaction not known  . Sulfonamide Derivatives     REACTION: Reaction not known    Family History  Problem Relation Age of Onset  . Heart attack Brother 65    MI  . Coronary artery disease      family history    History   Social History  . Marital Status: Married    Spouse Name: N/A    Number of Children: N/A  . Years of Education: N/A   Occupational History  . Housewife    Social History Main Topics  . Smoking status: Never Smoker   . Smokeless tobacco: Not on file    . Alcohol Use: No  . Drug Use: No  . Sexually Active: Not on file   Other Topics Concern  . Not on file   Social History Narrative   [place] with husbandDoes not abuse caffeineDoes not exercise regularly but is active caring for her grandchildren, one of whom is 48 years old    ROS ALL NEGATIVE EXCEPT THOSE NOTED IN HPI  PE  General Appearance: well developed, well nourished in no acute distress HEENT: symmetrical face, PERRLA, good dentition  Neck: no JVD, thyromegaly, or adenopathy, trachea midline Chest: symmetric without deformity Cardiac: PMI non-displaced, RRR, normal S1, S2, no gallop or murmur Lung: clear to ausculation and percussion Vascular: all pulses full without bruits  Abdominal: nondistended, nontender, good bowel sounds, no HSM, no bruits Extremities: no cyanosis, clubbing or edema, no sign of DVT, no varicosities  Skin: normal color, no rashes Neuro: alert and oriented x 3, non-focal Pysch: normal affect  EKG Sinus bradycardia, poor progression, diffuse in 1 and a the L. which appear to be new. I wonder about lead reversal as well. BMET No results found for this basename: na, k, cl, co2, glucose, bun,  creatinine, calcium, gfrnonaa, gfraa    Lipid Panel     Component Value Date/Time   CHOL 169 04/18/2009 0338   TRIG 111 04/18/2009 0338   HDL 79 04/18/2009 0338   CHOLHDL 2.1 Ratio 04/18/2009 0338   VLDL 22 04/18/2009 0338   LDLCALC 68 04/18/2009 0338    CBC No results found for this basename: wbc, rbc, hgb, hct, plt, mcv, mch, mchc, rdw, neutrabs, lymphsabs, monoabs, eosabs, basosabs

## 2011-11-25 NOTE — Assessment & Plan Note (Signed)
She is very concerned about these brief episodes. She's under a lot of stress with her husband not being well. She has no symptoms or signs of congestive heart failure or any pulmonary issue. EKG shows no acute changes. Reassurance given.

## 2011-11-25 NOTE — Assessment & Plan Note (Signed)
Continue statin. 

## 2011-12-03 ENCOUNTER — Other Ambulatory Visit: Payer: Self-pay | Admitting: *Deleted

## 2011-12-03 MED ORDER — LEVOTHYROXINE SODIUM 75 MCG PO TABS: 75.0000 ug | ORAL_TABLET | Freq: Every day | ORAL | Status: DC

## 2012-03-09 ENCOUNTER — Other Ambulatory Visit: Payer: Self-pay | Admitting: Cardiology

## 2012-03-11 ENCOUNTER — Other Ambulatory Visit: Payer: Self-pay | Admitting: *Deleted

## 2012-03-11 MED ORDER — METOPROLOL SUCCINATE ER 25 MG PO TB24: 25.0000 mg | ORAL_TABLET | Freq: Every day | ORAL | Status: DC

## 2012-03-11 MED ORDER — ROSUVASTATIN CALCIUM 10 MG PO TABS: 10.0000 mg | ORAL_TABLET | Freq: Every day | ORAL | Status: DC

## 2012-10-25 ENCOUNTER — Other Ambulatory Visit: Payer: Self-pay | Admitting: *Deleted

## 2012-10-25 MED ORDER — METOPROLOL SUCCINATE ER 25 MG PO TB24: 25.0000 mg | ORAL_TABLET | Freq: Every day | ORAL | Status: DC

## 2013-01-12 ENCOUNTER — Ambulatory Visit: Payer: Self-pay | Admitting: Internal Medicine

## 2013-05-07 ENCOUNTER — Other Ambulatory Visit: Payer: Self-pay | Admitting: Cardiology

## 2013-06-09 ENCOUNTER — Other Ambulatory Visit: Payer: Self-pay | Admitting: Cardiology

## 2013-06-09 ENCOUNTER — Other Ambulatory Visit: Payer: Self-pay

## 2013-06-09 ENCOUNTER — Other Ambulatory Visit: Payer: Self-pay | Admitting: *Deleted

## 2013-06-09 MED ORDER — METOPROLOL SUCCINATE ER 25 MG PO TB24: ORAL_TABLET | ORAL | Status: DC

## 2013-06-09 MED ORDER — ROSUVASTATIN CALCIUM 10 MG PO TABS: ORAL_TABLET | ORAL | Status: DC

## 2013-06-09 NOTE — Telephone Encounter (Signed)
Requested Prescriptions   Signed Prescriptions Disp Refills  . metoprolol succinate (TOPROL-XL) 25 MG 24 hr tablet 30 tablet 0    Sig: TAKE 1 TABLET EVERY DAY    Authorizing Provider: Minna Merritts    Ordering User: Britt Bottom

## 2013-06-09 NOTE — Telephone Encounter (Signed)
Requested Prescriptions   Signed Prescriptions Disp Refills  . rosuvastatin (CRESTOR) 10 MG tablet 30 tablet 0    Sig: TAKE ONE TABLET AT BEDTIME    Authorizing Provider: Minna Merritts    Ordering User: Britt Bottom

## 2013-06-10 ENCOUNTER — Other Ambulatory Visit: Payer: Self-pay

## 2013-06-10 MED ORDER — METOPROLOL SUCCINATE ER 25 MG PO TB24: 25.0000 mg | ORAL_TABLET | Freq: Every day | ORAL | Status: DC

## 2013-06-10 MED ORDER — ROSUVASTATIN CALCIUM 10 MG PO TABS: ORAL_TABLET | ORAL | Status: DC

## 2013-06-10 NOTE — Telephone Encounter (Signed)
Refill sent for crestor.  

## 2013-06-10 NOTE — Telephone Encounter (Signed)
Refill sent for metoprolol succ 

## 2013-06-22 ENCOUNTER — Ambulatory Visit: Payer: Self-pay | Admitting: Internal Medicine

## 2013-06-22 ENCOUNTER — Ambulatory Visit: Payer: Medicare PPO | Admitting: Cardiovascular Disease

## 2013-06-28 ENCOUNTER — Ambulatory Visit: Payer: Medicare PPO | Admitting: Cardiovascular Disease

## 2013-07-05 ENCOUNTER — Ambulatory Visit: Payer: Medicare PPO | Admitting: Cardiovascular Disease

## 2013-07-13 ENCOUNTER — Institutional Professional Consult (permissible substitution): Payer: Medicare PPO | Admitting: Cardiovascular Disease

## 2013-07-25 ENCOUNTER — Institutional Professional Consult (permissible substitution): Payer: Medicare PPO | Admitting: Cardiovascular Disease

## 2014-03-28 ENCOUNTER — Ambulatory Visit: Payer: Self-pay | Admitting: Internal Medicine

## 2014-06-23 ENCOUNTER — Encounter: Payer: Self-pay | Admitting: Cardiology

## 2014-08-31 ENCOUNTER — Other Ambulatory Visit: Payer: Self-pay | Admitting: Cardiology

## 2015-06-13 ENCOUNTER — Other Ambulatory Visit: Payer: Self-pay | Admitting: Neurology

## 2015-06-13 DIAGNOSIS — R413 Other amnesia: Secondary | ICD-10-CM

## 2015-06-29 ENCOUNTER — Ambulatory Visit: Admission: RE | Admit: 2015-06-29 | Payer: Medicare PPO | Source: Ambulatory Visit

## 2015-09-21 DIAGNOSIS — I2510 Atherosclerotic heart disease of native coronary artery without angina pectoris: Secondary | ICD-10-CM | POA: Diagnosis not present

## 2015-09-21 DIAGNOSIS — G301 Alzheimer's disease with late onset: Secondary | ICD-10-CM | POA: Diagnosis not present

## 2015-09-21 DIAGNOSIS — E039 Hypothyroidism, unspecified: Secondary | ICD-10-CM | POA: Diagnosis not present

## 2015-09-21 DIAGNOSIS — D519 Vitamin B12 deficiency anemia, unspecified: Secondary | ICD-10-CM | POA: Diagnosis not present

## 2015-09-21 DIAGNOSIS — F39 Unspecified mood [affective] disorder: Secondary | ICD-10-CM

## 2015-10-17 DIAGNOSIS — N8110 Cystocele, unspecified: Secondary | ICD-10-CM | POA: Diagnosis not present

## 2015-11-30 DIAGNOSIS — F39 Unspecified mood [affective] disorder: Secondary | ICD-10-CM

## 2015-11-30 DIAGNOSIS — E039 Hypothyroidism, unspecified: Secondary | ICD-10-CM | POA: Diagnosis not present

## 2015-11-30 DIAGNOSIS — D519 Vitamin B12 deficiency anemia, unspecified: Secondary | ICD-10-CM | POA: Diagnosis not present

## 2015-11-30 DIAGNOSIS — G309 Alzheimer's disease, unspecified: Secondary | ICD-10-CM | POA: Diagnosis not present

## 2015-11-30 DIAGNOSIS — I2510 Atherosclerotic heart disease of native coronary artery without angina pectoris: Secondary | ICD-10-CM | POA: Diagnosis not present

## 2015-12-20 DIAGNOSIS — L03116 Cellulitis of left lower limb: Secondary | ICD-10-CM | POA: Diagnosis not present

## 2016-01-25 DIAGNOSIS — E539 Vitamin B deficiency, unspecified: Secondary | ICD-10-CM | POA: Diagnosis not present

## 2016-01-25 DIAGNOSIS — F39 Unspecified mood [affective] disorder: Secondary | ICD-10-CM

## 2016-01-25 DIAGNOSIS — E039 Hypothyroidism, unspecified: Secondary | ICD-10-CM | POA: Diagnosis not present

## 2016-01-25 DIAGNOSIS — G309 Alzheimer's disease, unspecified: Secondary | ICD-10-CM | POA: Diagnosis not present

## 2016-01-25 DIAGNOSIS — I2510 Atherosclerotic heart disease of native coronary artery without angina pectoris: Secondary | ICD-10-CM | POA: Diagnosis not present

## 2016-01-28 DIAGNOSIS — L821 Other seborrheic keratosis: Secondary | ICD-10-CM | POA: Diagnosis not present

## 2016-03-26 DIAGNOSIS — F22 Delusional disorders: Secondary | ICD-10-CM

## 2016-03-26 DIAGNOSIS — G309 Alzheimer's disease, unspecified: Secondary | ICD-10-CM | POA: Diagnosis not present

## 2016-03-26 DIAGNOSIS — E039 Hypothyroidism, unspecified: Secondary | ICD-10-CM | POA: Diagnosis not present

## 2016-03-26 DIAGNOSIS — D510 Vitamin B12 deficiency anemia due to intrinsic factor deficiency: Secondary | ICD-10-CM | POA: Diagnosis not present

## 2016-03-26 DIAGNOSIS — I2510 Atherosclerotic heart disease of native coronary artery without angina pectoris: Secondary | ICD-10-CM | POA: Diagnosis not present

## 2016-04-23 DIAGNOSIS — J069 Acute upper respiratory infection, unspecified: Secondary | ICD-10-CM

## 2016-06-04 DIAGNOSIS — G309 Alzheimer's disease, unspecified: Secondary | ICD-10-CM | POA: Diagnosis not present

## 2016-06-04 DIAGNOSIS — D159 Benign neoplasm of intrathoracic organ, unspecified: Secondary | ICD-10-CM | POA: Diagnosis not present

## 2016-06-04 DIAGNOSIS — F323 Major depressive disorder, single episode, severe with psychotic features: Secondary | ICD-10-CM

## 2016-06-04 DIAGNOSIS — E039 Hypothyroidism, unspecified: Secondary | ICD-10-CM | POA: Diagnosis not present

## 2016-06-04 DIAGNOSIS — I2510 Atherosclerotic heart disease of native coronary artery without angina pectoris: Secondary | ICD-10-CM | POA: Diagnosis not present

## 2016-08-01 DIAGNOSIS — D519 Vitamin B12 deficiency anemia, unspecified: Secondary | ICD-10-CM | POA: Diagnosis not present

## 2016-08-01 DIAGNOSIS — G309 Alzheimer's disease, unspecified: Secondary | ICD-10-CM | POA: Diagnosis not present

## 2016-08-01 DIAGNOSIS — E039 Hypothyroidism, unspecified: Secondary | ICD-10-CM | POA: Diagnosis not present

## 2016-08-01 DIAGNOSIS — I2510 Atherosclerotic heart disease of native coronary artery without angina pectoris: Secondary | ICD-10-CM | POA: Diagnosis not present

## 2016-08-01 DIAGNOSIS — F39 Unspecified mood [affective] disorder: Secondary | ICD-10-CM | POA: Diagnosis not present

## 2016-09-19 DIAGNOSIS — F39 Unspecified mood [affective] disorder: Secondary | ICD-10-CM | POA: Diagnosis not present

## 2016-09-19 DIAGNOSIS — E039 Hypothyroidism, unspecified: Secondary | ICD-10-CM | POA: Diagnosis not present

## 2016-09-19 DIAGNOSIS — D519 Vitamin B12 deficiency anemia, unspecified: Secondary | ICD-10-CM | POA: Diagnosis not present

## 2016-09-19 DIAGNOSIS — G309 Alzheimer's disease, unspecified: Secondary | ICD-10-CM | POA: Diagnosis not present

## 2016-09-19 DIAGNOSIS — I2510 Atherosclerotic heart disease of native coronary artery without angina pectoris: Secondary | ICD-10-CM | POA: Diagnosis not present

## 2016-10-09 DIAGNOSIS — J040 Acute laryngitis: Secondary | ICD-10-CM | POA: Diagnosis not present

## 2016-11-21 DIAGNOSIS — F39 Unspecified mood [affective] disorder: Secondary | ICD-10-CM | POA: Diagnosis not present

## 2016-11-21 DIAGNOSIS — E039 Hypothyroidism, unspecified: Secondary | ICD-10-CM | POA: Diagnosis not present

## 2016-11-21 DIAGNOSIS — G309 Alzheimer's disease, unspecified: Secondary | ICD-10-CM | POA: Diagnosis not present

## 2016-11-21 DIAGNOSIS — I2510 Atherosclerotic heart disease of native coronary artery without angina pectoris: Secondary | ICD-10-CM | POA: Diagnosis not present

## 2016-12-18 DIAGNOSIS — Z634 Disappearance and death of family member: Secondary | ICD-10-CM | POA: Diagnosis not present

## 2017-01-26 DIAGNOSIS — J040 Acute laryngitis: Secondary | ICD-10-CM | POA: Diagnosis not present

## 2017-02-04 DIAGNOSIS — G309 Alzheimer's disease, unspecified: Secondary | ICD-10-CM | POA: Diagnosis not present

## 2017-02-04 DIAGNOSIS — I2510 Atherosclerotic heart disease of native coronary artery without angina pectoris: Secondary | ICD-10-CM | POA: Diagnosis not present

## 2017-02-04 DIAGNOSIS — F39 Unspecified mood [affective] disorder: Secondary | ICD-10-CM | POA: Diagnosis not present

## 2017-02-04 DIAGNOSIS — E039 Hypothyroidism, unspecified: Secondary | ICD-10-CM | POA: Diagnosis not present

## 2017-03-16 DIAGNOSIS — I2510 Atherosclerotic heart disease of native coronary artery without angina pectoris: Secondary | ICD-10-CM | POA: Diagnosis not present

## 2017-03-16 DIAGNOSIS — I10 Essential (primary) hypertension: Secondary | ICD-10-CM | POA: Diagnosis not present

## 2017-03-16 DIAGNOSIS — E039 Hypothyroidism, unspecified: Secondary | ICD-10-CM | POA: Diagnosis not present

## 2017-03-16 DIAGNOSIS — D518 Other vitamin B12 deficiency anemias: Secondary | ICD-10-CM | POA: Diagnosis not present

## 2017-03-16 DIAGNOSIS — E785 Hyperlipidemia, unspecified: Secondary | ICD-10-CM | POA: Diagnosis not present

## 2017-03-18 DIAGNOSIS — F39 Unspecified mood [affective] disorder: Secondary | ICD-10-CM | POA: Diagnosis not present

## 2017-03-18 DIAGNOSIS — E039 Hypothyroidism, unspecified: Secondary | ICD-10-CM | POA: Diagnosis not present

## 2017-03-18 DIAGNOSIS — G309 Alzheimer's disease, unspecified: Secondary | ICD-10-CM | POA: Diagnosis not present

## 2017-03-18 DIAGNOSIS — I2510 Atherosclerotic heart disease of native coronary artery without angina pectoris: Secondary | ICD-10-CM | POA: Diagnosis not present

## 2017-05-20 DIAGNOSIS — E039 Hypothyroidism, unspecified: Secondary | ICD-10-CM | POA: Diagnosis not present

## 2017-05-20 DIAGNOSIS — I2510 Atherosclerotic heart disease of native coronary artery without angina pectoris: Secondary | ICD-10-CM | POA: Diagnosis not present

## 2017-05-20 DIAGNOSIS — F39 Unspecified mood [affective] disorder: Secondary | ICD-10-CM | POA: Diagnosis not present

## 2017-05-20 DIAGNOSIS — G309 Alzheimer's disease, unspecified: Secondary | ICD-10-CM | POA: Diagnosis not present

## 2017-07-17 DIAGNOSIS — E039 Hypothyroidism, unspecified: Secondary | ICD-10-CM | POA: Diagnosis not present

## 2017-07-17 DIAGNOSIS — G309 Alzheimer's disease, unspecified: Secondary | ICD-10-CM | POA: Diagnosis not present

## 2017-07-17 DIAGNOSIS — I2510 Atherosclerotic heart disease of native coronary artery without angina pectoris: Secondary | ICD-10-CM | POA: Diagnosis not present

## 2017-07-17 DIAGNOSIS — F39 Unspecified mood [affective] disorder: Secondary | ICD-10-CM | POA: Diagnosis not present

## 2017-09-18 DIAGNOSIS — G309 Alzheimer's disease, unspecified: Secondary | ICD-10-CM | POA: Diagnosis not present

## 2017-09-18 DIAGNOSIS — F39 Unspecified mood [affective] disorder: Secondary | ICD-10-CM | POA: Diagnosis not present

## 2017-09-18 DIAGNOSIS — E039 Hypothyroidism, unspecified: Secondary | ICD-10-CM | POA: Diagnosis not present

## 2017-09-18 DIAGNOSIS — I2510 Atherosclerotic heart disease of native coronary artery without angina pectoris: Secondary | ICD-10-CM | POA: Diagnosis not present

## 2017-10-21 DIAGNOSIS — R05 Cough: Secondary | ICD-10-CM | POA: Diagnosis not present

## 2017-11-09 DIAGNOSIS — K047 Periapical abscess without sinus: Secondary | ICD-10-CM | POA: Diagnosis not present

## 2017-11-20 DIAGNOSIS — E039 Hypothyroidism, unspecified: Secondary | ICD-10-CM | POA: Diagnosis not present

## 2017-11-20 DIAGNOSIS — I2510 Atherosclerotic heart disease of native coronary artery without angina pectoris: Secondary | ICD-10-CM | POA: Diagnosis not present

## 2017-11-20 DIAGNOSIS — G309 Alzheimer's disease, unspecified: Secondary | ICD-10-CM | POA: Diagnosis not present

## 2017-11-20 DIAGNOSIS — F39 Unspecified mood [affective] disorder: Secondary | ICD-10-CM | POA: Diagnosis not present

## 2018-01-13 DIAGNOSIS — G309 Alzheimer's disease, unspecified: Secondary | ICD-10-CM | POA: Diagnosis not present

## 2018-01-13 DIAGNOSIS — I2510 Atherosclerotic heart disease of native coronary artery without angina pectoris: Secondary | ICD-10-CM | POA: Diagnosis not present

## 2018-01-13 DIAGNOSIS — F39 Unspecified mood [affective] disorder: Secondary | ICD-10-CM | POA: Diagnosis not present

## 2018-01-13 DIAGNOSIS — E039 Hypothyroidism, unspecified: Secondary | ICD-10-CM | POA: Diagnosis not present

## 2018-01-28 DIAGNOSIS — J069 Acute upper respiratory infection, unspecified: Secondary | ICD-10-CM | POA: Diagnosis not present

## 2018-03-18 DIAGNOSIS — I2510 Atherosclerotic heart disease of native coronary artery without angina pectoris: Secondary | ICD-10-CM | POA: Diagnosis not present

## 2018-03-18 DIAGNOSIS — I10 Essential (primary) hypertension: Secondary | ICD-10-CM | POA: Diagnosis not present

## 2018-03-18 DIAGNOSIS — E039 Hypothyroidism, unspecified: Secondary | ICD-10-CM | POA: Diagnosis not present

## 2018-03-18 DIAGNOSIS — E785 Hyperlipidemia, unspecified: Secondary | ICD-10-CM | POA: Diagnosis not present

## 2018-03-18 DIAGNOSIS — D518 Other vitamin B12 deficiency anemias: Secondary | ICD-10-CM | POA: Diagnosis not present

## 2018-03-19 DIAGNOSIS — E039 Hypothyroidism, unspecified: Secondary | ICD-10-CM | POA: Diagnosis not present

## 2018-03-19 DIAGNOSIS — I2510 Atherosclerotic heart disease of native coronary artery without angina pectoris: Secondary | ICD-10-CM | POA: Diagnosis not present

## 2018-03-19 DIAGNOSIS — F339 Major depressive disorder, recurrent, unspecified: Secondary | ICD-10-CM | POA: Diagnosis not present

## 2018-03-19 DIAGNOSIS — G309 Alzheimer's disease, unspecified: Secondary | ICD-10-CM | POA: Diagnosis not present

## 2018-05-29 DIAGNOSIS — R509 Fever, unspecified: Secondary | ICD-10-CM | POA: Diagnosis not present

## 2018-05-31 DIAGNOSIS — J189 Pneumonia, unspecified organism: Secondary | ICD-10-CM | POA: Diagnosis not present

## 2018-05-31 DIAGNOSIS — R509 Fever, unspecified: Secondary | ICD-10-CM | POA: Diagnosis not present

## 2018-07-28 DIAGNOSIS — E039 Hypothyroidism, unspecified: Secondary | ICD-10-CM | POA: Diagnosis not present

## 2018-07-28 DIAGNOSIS — I2510 Atherosclerotic heart disease of native coronary artery without angina pectoris: Secondary | ICD-10-CM | POA: Diagnosis not present

## 2018-07-28 DIAGNOSIS — G309 Alzheimer's disease, unspecified: Secondary | ICD-10-CM | POA: Diagnosis not present

## 2018-07-28 DIAGNOSIS — F39 Unspecified mood [affective] disorder: Secondary | ICD-10-CM | POA: Diagnosis not present

## 2018-08-06 DIAGNOSIS — K219 Gastro-esophageal reflux disease without esophagitis: Secondary | ICD-10-CM | POA: Diagnosis not present

## 2018-08-06 DIAGNOSIS — R0789 Other chest pain: Secondary | ICD-10-CM | POA: Diagnosis not present

## 2018-09-06 DIAGNOSIS — M25539 Pain in unspecified wrist: Secondary | ICD-10-CM | POA: Diagnosis not present

## 2018-09-16 DIAGNOSIS — F22 Delusional disorders: Secondary | ICD-10-CM | POA: Diagnosis not present

## 2018-09-23 DIAGNOSIS — I2510 Atherosclerotic heart disease of native coronary artery without angina pectoris: Secondary | ICD-10-CM | POA: Diagnosis not present

## 2018-09-23 DIAGNOSIS — G301 Alzheimer's disease with late onset: Secondary | ICD-10-CM | POA: Diagnosis not present

## 2018-09-23 DIAGNOSIS — F39 Unspecified mood [affective] disorder: Secondary | ICD-10-CM | POA: Diagnosis not present

## 2018-09-23 DIAGNOSIS — E039 Hypothyroidism, unspecified: Secondary | ICD-10-CM | POA: Diagnosis not present

## 2018-10-20 DIAGNOSIS — G309 Alzheimer's disease, unspecified: Secondary | ICD-10-CM | POA: Diagnosis not present

## 2018-10-20 DIAGNOSIS — E039 Hypothyroidism, unspecified: Secondary | ICD-10-CM | POA: Diagnosis not present

## 2018-10-20 DIAGNOSIS — I2510 Atherosclerotic heart disease of native coronary artery without angina pectoris: Secondary | ICD-10-CM | POA: Diagnosis not present

## 2018-10-20 DIAGNOSIS — F39 Unspecified mood [affective] disorder: Secondary | ICD-10-CM | POA: Diagnosis not present

## 2018-10-20 DIAGNOSIS — K219 Gastro-esophageal reflux disease without esophagitis: Secondary | ICD-10-CM | POA: Diagnosis not present

## 2018-11-25 DIAGNOSIS — I2510 Atherosclerotic heart disease of native coronary artery without angina pectoris: Secondary | ICD-10-CM | POA: Diagnosis not present

## 2018-11-25 DIAGNOSIS — G301 Alzheimer's disease with late onset: Secondary | ICD-10-CM | POA: Diagnosis not present

## 2018-11-25 DIAGNOSIS — E039 Hypothyroidism, unspecified: Secondary | ICD-10-CM | POA: Diagnosis not present

## 2018-11-25 DIAGNOSIS — F39 Unspecified mood [affective] disorder: Secondary | ICD-10-CM | POA: Diagnosis not present

## 2018-11-26 DIAGNOSIS — R5381 Other malaise: Secondary | ICD-10-CM | POA: Diagnosis not present

## 2018-11-26 DIAGNOSIS — R0781 Pleurodynia: Secondary | ICD-10-CM | POA: Diagnosis not present

## 2018-11-26 DIAGNOSIS — N644 Mastodynia: Secondary | ICD-10-CM | POA: Diagnosis not present

## 2018-11-26 DIAGNOSIS — R1011 Right upper quadrant pain: Secondary | ICD-10-CM | POA: Diagnosis not present

## 2018-12-01 DIAGNOSIS — R0781 Pleurodynia: Secondary | ICD-10-CM | POA: Diagnosis not present

## 2018-12-07 DIAGNOSIS — B342 Coronavirus infection, unspecified: Secondary | ICD-10-CM | POA: Diagnosis not present

## 2018-12-07 DIAGNOSIS — Z03818 Encounter for observation for suspected exposure to other biological agents ruled out: Secondary | ICD-10-CM | POA: Diagnosis not present

## 2019-01-14 DIAGNOSIS — F39 Unspecified mood [affective] disorder: Secondary | ICD-10-CM | POA: Diagnosis not present

## 2019-01-14 DIAGNOSIS — M1990 Unspecified osteoarthritis, unspecified site: Secondary | ICD-10-CM | POA: Diagnosis not present

## 2019-01-14 DIAGNOSIS — I2510 Atherosclerotic heart disease of native coronary artery without angina pectoris: Secondary | ICD-10-CM | POA: Diagnosis not present

## 2019-01-14 DIAGNOSIS — E039 Hypothyroidism, unspecified: Secondary | ICD-10-CM | POA: Diagnosis not present

## 2019-01-14 DIAGNOSIS — K219 Gastro-esophageal reflux disease without esophagitis: Secondary | ICD-10-CM | POA: Diagnosis not present

## 2019-01-14 DIAGNOSIS — G309 Alzheimer's disease, unspecified: Secondary | ICD-10-CM | POA: Diagnosis not present

## 2019-03-14 DIAGNOSIS — I2510 Atherosclerotic heart disease of native coronary artery without angina pectoris: Secondary | ICD-10-CM | POA: Diagnosis not present

## 2019-03-14 DIAGNOSIS — D519 Vitamin B12 deficiency anemia, unspecified: Secondary | ICD-10-CM | POA: Diagnosis not present

## 2019-03-14 DIAGNOSIS — E039 Hypothyroidism, unspecified: Secondary | ICD-10-CM | POA: Diagnosis not present

## 2019-03-22 DIAGNOSIS — G301 Alzheimer's disease with late onset: Secondary | ICD-10-CM | POA: Diagnosis not present

## 2019-03-22 DIAGNOSIS — J309 Allergic rhinitis, unspecified: Secondary | ICD-10-CM | POA: Diagnosis not present

## 2019-03-22 DIAGNOSIS — I2510 Atherosclerotic heart disease of native coronary artery without angina pectoris: Secondary | ICD-10-CM | POA: Diagnosis not present

## 2019-03-22 DIAGNOSIS — F39 Unspecified mood [affective] disorder: Secondary | ICD-10-CM | POA: Diagnosis not present

## 2019-05-18 DIAGNOSIS — G309 Alzheimer's disease, unspecified: Secondary | ICD-10-CM | POA: Diagnosis not present

## 2019-05-18 DIAGNOSIS — F39 Unspecified mood [affective] disorder: Secondary | ICD-10-CM | POA: Diagnosis not present

## 2019-05-18 DIAGNOSIS — E039 Hypothyroidism, unspecified: Secondary | ICD-10-CM | POA: Diagnosis not present

## 2019-05-18 DIAGNOSIS — K219 Gastro-esophageal reflux disease without esophagitis: Secondary | ICD-10-CM | POA: Diagnosis not present

## 2019-05-18 DIAGNOSIS — I2510 Atherosclerotic heart disease of native coronary artery without angina pectoris: Secondary | ICD-10-CM | POA: Diagnosis not present

## 2019-06-29 DIAGNOSIS — B351 Tinea unguium: Secondary | ICD-10-CM | POA: Diagnosis not present

## 2019-07-27 DIAGNOSIS — F39 Unspecified mood [affective] disorder: Secondary | ICD-10-CM | POA: Diagnosis not present

## 2019-07-27 DIAGNOSIS — I2510 Atherosclerotic heart disease of native coronary artery without angina pectoris: Secondary | ICD-10-CM | POA: Diagnosis not present

## 2019-07-27 DIAGNOSIS — E039 Hypothyroidism, unspecified: Secondary | ICD-10-CM | POA: Diagnosis not present

## 2019-07-27 DIAGNOSIS — G301 Alzheimer's disease with late onset: Secondary | ICD-10-CM | POA: Diagnosis not present

## 2019-09-07 DIAGNOSIS — B351 Tinea unguium: Secondary | ICD-10-CM | POA: Diagnosis not present

## 2019-09-16 DIAGNOSIS — E039 Hypothyroidism, unspecified: Secondary | ICD-10-CM | POA: Diagnosis not present

## 2019-09-16 DIAGNOSIS — G309 Alzheimer's disease, unspecified: Secondary | ICD-10-CM | POA: Diagnosis not present

## 2019-09-16 DIAGNOSIS — I2510 Atherosclerotic heart disease of native coronary artery without angina pectoris: Secondary | ICD-10-CM | POA: Diagnosis not present

## 2019-09-16 DIAGNOSIS — F39 Unspecified mood [affective] disorder: Secondary | ICD-10-CM | POA: Diagnosis not present

## 2019-09-16 DIAGNOSIS — K219 Gastro-esophageal reflux disease without esophagitis: Secondary | ICD-10-CM | POA: Diagnosis not present

## 2019-11-09 ENCOUNTER — Other Ambulatory Visit: Payer: Self-pay

## 2019-11-09 ENCOUNTER — Encounter: Payer: Self-pay | Admitting: Emergency Medicine

## 2019-11-09 ENCOUNTER — Emergency Department
Admission: EM | Admit: 2019-11-09 | Discharge: 2019-11-09 | Disposition: A | Discharge status: Skilled Nursing Facility | Payer: Medicare PPO | Attending: Emergency Medicine | Admitting: Emergency Medicine

## 2019-11-09 ENCOUNTER — Emergency Department: Payer: Medicare PPO

## 2019-11-09 DIAGNOSIS — R001 Bradycardia, unspecified: Secondary | ICD-10-CM | POA: Diagnosis not present

## 2019-11-09 DIAGNOSIS — I959 Hypotension, unspecified: Secondary | ICD-10-CM | POA: Diagnosis not present

## 2019-11-09 DIAGNOSIS — Z8679 Personal history of other diseases of the circulatory system: Secondary | ICD-10-CM | POA: Diagnosis not present

## 2019-11-09 DIAGNOSIS — F0390 Unspecified dementia without behavioral disturbance: Secondary | ICD-10-CM | POA: Insufficient documentation

## 2019-11-09 DIAGNOSIS — Z7982 Long term (current) use of aspirin: Secondary | ICD-10-CM | POA: Insufficient documentation

## 2019-11-09 DIAGNOSIS — R55 Syncope and collapse: Secondary | ICD-10-CM | POA: Diagnosis not present

## 2019-11-09 DIAGNOSIS — R4020 Unspecified coma: Secondary | ICD-10-CM | POA: Diagnosis not present

## 2019-11-09 DIAGNOSIS — R0689 Other abnormalities of breathing: Secondary | ICD-10-CM | POA: Diagnosis not present

## 2019-11-09 LAB — URINALYSIS, COMPLETE (UACMP) WITH MICROSCOPIC
Bacteria, UA: NONE SEEN
Bilirubin Urine: NEGATIVE
Glucose, UA: NEGATIVE mg/dL
Hgb urine dipstick: NEGATIVE
Ketones, ur: NEGATIVE mg/dL
Leukocytes,Ua: NEGATIVE
Nitrite: NEGATIVE
Protein, ur: NEGATIVE mg/dL
Specific Gravity, Urine: 1.009 (ref 1.005–1.030)
pH: 7 (ref 5.0–8.0)

## 2019-11-09 LAB — BASIC METABOLIC PANEL
Anion gap: 9 (ref 5–15)
BUN: 9 mg/dL (ref 8–23)
CO2: 24 mmol/L (ref 22–32)
Calcium: 8.3 mg/dL — ABNORMAL LOW (ref 8.9–10.3)
Chloride: 107 mmol/L (ref 98–111)
Creatinine, Ser: 0.92 mg/dL (ref 0.44–1.00)
GFR calc Af Amer: >60 mL/min (ref >60)
GFR calc non Af Amer: 56 mL/min — ABNORMAL LOW (ref >60)
Glucose, Bld: 123 mg/dL — ABNORMAL HIGH (ref 70–99)
Potassium: 4.4 mmol/L (ref 3.5–5.1)
Sodium: 140 mmol/L (ref 135–145)

## 2019-11-09 LAB — CBC WITH DIFFERENTIAL/PLATELET
Abs Immature Granulocytes: 0.04 10*3/uL (ref 0.00–0.07)
Basophils Absolute: 0.1 10*3/uL (ref 0.0–0.1)
Basophils Relative: 1 %
Eosinophils Absolute: 0.3 10*3/uL (ref 0.0–0.5)
Eosinophils Relative: 2 %
HCT: 36.5 % (ref 36.0–46.0)
Hemoglobin: 12 g/dL (ref 12.0–15.0)
Immature Granulocytes: 0 %
Lymphocytes Relative: 13 %
Lymphs Abs: 1.7 10*3/uL (ref 0.7–4.0)
MCH: 33 pg (ref 26.0–34.0)
MCHC: 32.9 g/dL (ref 30.0–36.0)
MCV: 100.3 fL — ABNORMAL HIGH (ref 80.0–100.0)
Monocytes Absolute: 0.8 10*3/uL (ref 0.1–1.0)
Monocytes Relative: 6 %
Neutro Abs: 10 10*3/uL — ABNORMAL HIGH (ref 1.7–7.7)
Neutrophils Relative %: 78 %
Platelets: 199 10*3/uL (ref 150–400)
RBC: 3.64 MIL/uL — ABNORMAL LOW (ref 3.87–5.11)
RDW: 12.9 % (ref 11.5–15.5)
WBC: 12.9 10*3/uL — ABNORMAL HIGH (ref 4.0–10.5)
nRBC: 0 % (ref 0.0–0.2)

## 2019-11-09 LAB — TROPONIN I (HIGH SENSITIVITY): Troponin I (High Sensitivity): 4 ng/L (ref <18)

## 2019-11-09 MED ORDER — LACTATED RINGERS IV BOLUS
1000.0000 mL | Freq: Once | INTRAVENOUS | Status: AC
  Administered 2019-11-09: 1000 mL via INTRAVENOUS

## 2019-11-09 NOTE — ED Provider Notes (Signed)
[facility] Emergency Department Provider Note   ____________________________________________   First MD Initiated Contact with Patient 11/09/19 [phone]     (approximate)  I have reviewed the triage vital signs and the nursing notes.   HISTORY  Chief Complaint Loss of Consciousness    HPI Cassie Harrington is a 86 y.o. female with past medical history of CAD, hyperlipidemia, and dementia who presents to the ED for syncope.  Patient arrives via EMS from [facility] memory care and history is limited due to her baseline dementia.  Per EMS, patient was ambulating with assistance when she seemed to pass out.  Staff was able to lower her into a chair and she returned to her baseline mental status.  She did not hit her head or suffer any traumatic injuries.  On arrival, patient states she does not remember what happened but states that "I feel miserable all over" and "I want to go home".  She denies any specific areas of pain, denies any chest pain or shortness of breath.        Past Medical History:  Diagnosis Date  . CAD (coronary artery disease)    Family hx  . Hyperlipidemia   . Palpitations    Tachy, minimized with low does beta blocker  . Rapid palpitations     Patient Active Problem List   Diagnosis Date Noted  . Shortness of breath 11/25/2011  . Abnormal EKG 11/25/2011  . HYPERLIPIDEMIA TYPE IIB / III 04/10/2009  . CAD, NATIVE VESSEL 04/10/2009  . CHEST PAIN, ATYPICAL 01/23/2009    Past Surgical History:  Procedure Laterality Date  . LITHOTRIPSY    . TONSILLECTOMY    . VESICOVAGINAL FISTULA CLOSURE W/ TAH      Prior to Admission medications   Medication Sig Start Date End Date Taking? Authorizing Provider  aspirin 81 MG EC tablet Take 1 tablet (81 mg total) by mouth at bedtime. 11/21/10   Wall, Marijo Conception, MD  Calcium Carbonate-Vitamin D (CALCIUM 600 + D PO) Take by mouth daily.    [provider]  cholecalciferol (VITAMIN D) 1000 UNITS  tablet Take 1,000 Units by mouth daily.    [provider]  CRESTOR 10 MG tablet TAKE ONE TABLET AT BEDTIME 06/09/13   Minna Merritts, MD  levothyroxine (SYNTHROID, LEVOTHROID) 75 MCG tablet Take 1 tablet (75 mcg total) by mouth daily. 12/03/11   Wall, Marijo Conception, MD  metoprolol succinate (TOPROL-XL) 25 MG 24 hr tablet TAKE 1 TABLET EVERY DAY 06/09/13   Minna Merritts, MD  metoprolol succinate (TOPROL-XL) 25 MG 24 hr tablet Take 1 tablet (25 mg total) by mouth daily. 06/10/13   Minna Merritts, MD  rosuvastatin (CRESTOR) 10 MG tablet TAKE ONE TABLET AT BEDTIME 06/10/13   Minna Merritts, MD    Allergies Prednisone and Sulfonamide derivatives  Family History  Problem Relation Age of Onset  . Heart attack Brother 52       MI  . Coronary artery disease Other        family history    Social History Social History   Tobacco Use  . Smoking status: Never Smoker  . Smokeless tobacco: Never Used  Vaping Use  . Vaping Use: Never used  Substance Use Topics  . Alcohol use: No  . Drug use: No    Review of Systems  Constitutional: No fever/chills Eyes: No visual changes. ENT: No sore throat. Cardiovascular: Denies chest pain.  Positive for syncope. Respiratory:  Denies shortness of breath. Gastrointestinal: No abdominal pain.  No nausea, no vomiting.  No diarrhea.  No constipation. Genitourinary: Negative for dysuria. Musculoskeletal: Negative for back pain. Skin: Negative for rash. Neurological: Negative for headaches, focal weakness or numbness.  ____________________________________________   PHYSICAL EXAM:  VITAL SIGNS: ED Triage Vitals  Enc Vitals Group     BP 11/09/19 0848 (!) 103/54     Pulse Rate 11/09/19 0848 (!) 56     Resp 11/09/19 0848 20     Temp --      Temp src --      SpO2 11/09/19 0848 98 %     Weight 11/09/19 0850 138 lb 7.2 oz (62.8 kg)     Height 11/09/19 0850 5\' 5"  (1.651 m)     Head Circumference --      Peak Flow --      Pain Score --       Pain Loc --      Pain Edu? --      Excl. in [place]? --     Constitutional: Alert and oriented to person, but not place or time. Eyes: Conjunctivae are normal. Head: Atraumatic. Nose: No congestion/rhinnorhea. Mouth/Throat: Mucous membranes are moist. Neck: Normal ROM Cardiovascular: Normal rate, regular rhythm. Grossly normal heart sounds. Respiratory: Normal respiratory effort.  No retractions. Lungs CTAB. Gastrointestinal: Soft and nontender. No distention. Genitourinary: deferred Musculoskeletal: No lower extremity tenderness nor edema. Neurologic:  Normal speech and language. No gross focal neurologic deficits are appreciated. Skin:  Skin is warm, dry and intact. No rash noted. Psychiatric: Mood and affect are normal. Speech and behavior are normal.  ____________________________________________   LABS (all labs ordered are listed, but only abnormal results are displayed)  Labs Reviewed  BASIC METABOLIC PANEL - Abnormal; Notable for the following components:      Result Value   Glucose, Bld 123 (*)    Calcium 8.3 (*)    GFR calc non Af Amer 56 (*)    All other components within normal limits  CBC WITH DIFFERENTIAL/PLATELET - Abnormal; Notable for the following components:   WBC 12.9 (*)    RBC 3.64 (*)    MCV 100.3 (*)    Neutro Abs 10.0 (*)    All other components within normal limits  URINALYSIS, COMPLETE (UACMP) WITH MICROSCOPIC - Abnormal; Notable for the following components:   Color, Urine YELLOW (*)    APPearance CLEAR (*)    All other components within normal limits  TROPONIN I (HIGH SENSITIVITY)   ____________________________________________  EKG  ED ECG REPORT I, Blake Divine, the attending physician, personally viewed and interpreted this ECG.   Date: 11/09/2019  EKG Time: 8:49  Rate: 63  Rhythm: normal sinus rhythm  Axis: Normal  Intervals:first-degree A-V block   ST&T Change: None   PROCEDURES  Procedure(s) performed (including Critical  Care):  Procedures   ____________________________________________   INITIAL IMPRESSION / ASSESSMENT AND PLAN / ED COURSE       86 year old female with past medical history of CAD, hyperlipidemia, and dementia who presents to the ED following a syncopal episode witnessed by staff at her nursing facility.  Patient was lowered into a chair and did not have any apparent traumatic injury.  EKG shows normal sinus rhythm with first-degree heart block and no acute ischemic changes.  Patient states she "feels miserable" but cannot specify any specific pains, does deny chest pain or shortness of breath.  We will screen labs and observe on cardiac monitor, if  work-up is unremarkable she may be appropriate for discharge back to nursing facility.  Lab work is unremarkable, patient remains in normal sinus rhythm on cardiac monitor.  She reports feeling better following IV fluids, chest x-ray and UA showed no evidence of infectious process.  She is appropriate for discharge back to nursing facility.      ____________________________________________   FINAL CLINICAL IMPRESSION(S) / ED DIAGNOSES  Final diagnoses:  Syncope, unspecified syncope type     ED Discharge Orders    None       Note:  This document was prepared using Dragon voice recognition software and may include unintentional dictation errors.   Blake Divine, MD 11/09/19 1008

## 2019-11-09 NOTE — ED Notes (Signed)
Electronic Signature not obtained at discharge due to baseline cognitive status of patient; family is not present at this time.

## 2019-11-09 NOTE — ED Triage Notes (Signed)
Pt in via [facility] from [facility].  Per EMS, pt was ambulating with assistance when she had a syncopal episode in which she was placed into a chair.  Pt states, "I feel miserable all over."  Pt alert, oriented to self only.  Vitals WDL.

## 2019-11-09 NOTE — ED Notes (Signed)
Radiology to bedside. 

## 2019-11-22 DIAGNOSIS — F39 Unspecified mood [affective] disorder: Secondary | ICD-10-CM | POA: Diagnosis not present

## 2019-11-22 DIAGNOSIS — G301 Alzheimer's disease with late onset: Secondary | ICD-10-CM | POA: Diagnosis not present

## 2019-11-22 DIAGNOSIS — I2510 Atherosclerotic heart disease of native coronary artery without angina pectoris: Secondary | ICD-10-CM | POA: Diagnosis not present

## 2019-11-22 DIAGNOSIS — N1831 Chronic kidney disease, stage 3a: Secondary | ICD-10-CM | POA: Diagnosis not present

## 2019-11-25 DIAGNOSIS — B351 Tinea unguium: Secondary | ICD-10-CM | POA: Diagnosis not present

## 2019-12-06 DIAGNOSIS — F39 Unspecified mood [affective] disorder: Secondary | ICD-10-CM | POA: Diagnosis not present

## 2019-12-06 DIAGNOSIS — Z741 Need for assistance with personal care: Secondary | ICD-10-CM | POA: Diagnosis not present

## 2019-12-06 DIAGNOSIS — R4189 Other symptoms and signs involving cognitive functions and awareness: Secondary | ICD-10-CM | POA: Diagnosis not present

## 2019-12-06 DIAGNOSIS — G301 Alzheimer's disease with late onset: Secondary | ICD-10-CM | POA: Diagnosis not present

## 2019-12-06 DIAGNOSIS — E039 Hypothyroidism, unspecified: Secondary | ICD-10-CM | POA: Diagnosis not present

## 2019-12-06 DIAGNOSIS — K219 Gastro-esophageal reflux disease without esophagitis: Secondary | ICD-10-CM | POA: Diagnosis not present

## 2019-12-06 DIAGNOSIS — R278 Other lack of coordination: Secondary | ICD-10-CM | POA: Diagnosis not present

## 2019-12-16 DIAGNOSIS — S9031XA Contusion of right foot, initial encounter: Secondary | ICD-10-CM | POA: Diagnosis not present

## 2019-12-16 DIAGNOSIS — M25474 Effusion, right foot: Secondary | ICD-10-CM | POA: Diagnosis not present

## 2019-12-16 DIAGNOSIS — S0093XA Contusion of unspecified part of head, initial encounter: Secondary | ICD-10-CM | POA: Diagnosis not present

## 2019-12-16 DIAGNOSIS — W19XXXA Unspecified fall, initial encounter: Secondary | ICD-10-CM | POA: Diagnosis not present

## 2019-12-16 DIAGNOSIS — M79671 Pain in right foot: Secondary | ICD-10-CM | POA: Diagnosis not present

## 2020-01-10 ENCOUNTER — Emergency Department: Payer: Medicare PPO

## 2020-01-10 ENCOUNTER — Encounter: Payer: Self-pay | Admitting: Emergency Medicine

## 2020-01-10 ENCOUNTER — Other Ambulatory Visit: Payer: Self-pay

## 2020-01-10 ENCOUNTER — Emergency Department
Admission: EM | Admit: 2020-01-10 | Discharge: 2020-01-10 | Disposition: A | Discharge status: Home / Self Care | Payer: Medicare PPO | Attending: Emergency Medicine | Admitting: Emergency Medicine

## 2020-01-10 DIAGNOSIS — R262 Difficulty in walking, not elsewhere classified: Secondary | ICD-10-CM | POA: Diagnosis not present

## 2020-01-10 DIAGNOSIS — I7090 Unspecified atherosclerosis: Secondary | ICD-10-CM | POA: Diagnosis not present

## 2020-01-10 DIAGNOSIS — I2510 Atherosclerotic heart disease of native coronary artery without angina pectoris: Secondary | ICD-10-CM | POA: Diagnosis not present

## 2020-01-10 DIAGNOSIS — R6883 Chills (without fever): Secondary | ICD-10-CM | POA: Diagnosis not present

## 2020-01-10 DIAGNOSIS — R519 Headache, unspecified: Secondary | ICD-10-CM

## 2020-01-10 DIAGNOSIS — Z7982 Long term (current) use of aspirin: Secondary | ICD-10-CM | POA: Diagnosis not present

## 2020-01-10 DIAGNOSIS — G9389 Other specified disorders of brain: Secondary | ICD-10-CM | POA: Diagnosis not present

## 2020-01-10 DIAGNOSIS — R404 Transient alteration of awareness: Secondary | ICD-10-CM | POA: Diagnosis not present

## 2020-01-10 DIAGNOSIS — Z20822 Contact with and (suspected) exposure to covid-19: Secondary | ICD-10-CM | POA: Insufficient documentation

## 2020-01-10 DIAGNOSIS — R464 Slowness and poor responsiveness: Secondary | ICD-10-CM | POA: Diagnosis not present

## 2020-01-10 DIAGNOSIS — I959 Hypotension, unspecified: Secondary | ICD-10-CM | POA: Diagnosis not present

## 2020-01-10 DIAGNOSIS — I700 Atherosclerosis of aorta: Secondary | ICD-10-CM | POA: Diagnosis not present

## 2020-01-10 DIAGNOSIS — Z79899 Other long term (current) drug therapy: Secondary | ICD-10-CM | POA: Insufficient documentation

## 2020-01-10 DIAGNOSIS — G319 Degenerative disease of nervous system, unspecified: Secondary | ICD-10-CM | POA: Diagnosis not present

## 2020-01-10 DIAGNOSIS — R251 Tremor, unspecified: Secondary | ICD-10-CM | POA: Diagnosis not present

## 2020-01-10 DIAGNOSIS — J3489 Other specified disorders of nose and nasal sinuses: Secondary | ICD-10-CM | POA: Diagnosis not present

## 2020-01-10 LAB — CBC WITH DIFFERENTIAL/PLATELET
Abs Immature Granulocytes: 0.03 10*3/uL (ref 0.00–0.07)
Basophils Absolute: 0.1 10*3/uL (ref 0.0–0.1)
Basophils Relative: 1 %
Eosinophils Absolute: 0.2 10*3/uL (ref 0.0–0.5)
Eosinophils Relative: 3 %
HCT: 38.7 % (ref 36.0–46.0)
Hemoglobin: 12.7 g/dL (ref 12.0–15.0)
Immature Granulocytes: 0 %
Lymphocytes Relative: 25 %
Lymphs Abs: 2 10*3/uL (ref 0.7–4.0)
MCH: 32.9 pg (ref 26.0–34.0)
MCHC: 32.8 g/dL (ref 30.0–36.0)
MCV: 100.3 fL — ABNORMAL HIGH (ref 80.0–100.0)
Monocytes Absolute: 0.6 10*3/uL (ref 0.1–1.0)
Monocytes Relative: 7 %
Neutro Abs: 5.2 10*3/uL (ref 1.7–7.7)
Neutrophils Relative %: 64 %
Platelets: 226 10*3/uL (ref 150–400)
RBC: 3.86 MIL/uL — ABNORMAL LOW (ref 3.87–5.11)
RDW: 13.3 % (ref 11.5–15.5)
WBC: 8.1 10*3/uL (ref 4.0–10.5)
nRBC: 0 % (ref 0.0–0.2)

## 2020-01-10 LAB — COMPREHENSIVE METABOLIC PANEL
ALT: 10 U/L (ref 0–44)
AST: 18 U/L (ref 15–41)
Albumin: 4.2 g/dL (ref 3.5–5.0)
Alkaline Phosphatase: 53 U/L (ref 38–126)
Anion gap: 7 (ref 5–15)
BUN: 8 mg/dL (ref 8–23)
CO2: 27 mmol/L (ref 22–32)
Calcium: 9.2 mg/dL (ref 8.9–10.3)
Chloride: 104 mmol/L (ref 98–111)
Creatinine, Ser: 0.76 mg/dL (ref 0.44–1.00)
GFR calc Af Amer: >60 mL/min (ref >60)
GFR calc non Af Amer: >60 mL/min (ref >60)
Glucose, Bld: 107 mg/dL — ABNORMAL HIGH (ref 70–99)
Potassium: 4.1 mmol/L (ref 3.5–5.1)
Sodium: 138 mmol/L (ref 135–145)
Total Bilirubin: 1.3 mg/dL — ABNORMAL HIGH (ref 0.3–1.2)
Total Protein: 7 g/dL (ref 6.5–8.1)

## 2020-01-10 LAB — PROCALCITONIN: Procalcitonin: <0.1 ng/mL

## 2020-01-10 LAB — SARS CORONAVIRUS 2 BY RT PCR (HOSPITAL ORDER, PERFORMED IN ~~LOC~~ HOSPITAL LAB): SARS Coronavirus 2: NEGATIVE

## 2020-01-10 LAB — GLUCOSE, CAPILLARY: Glucose-Capillary: 84 mg/dL (ref 70–99)

## 2020-01-10 LAB — TROPONIN I (HIGH SENSITIVITY): Troponin I (High Sensitivity): 3 ng/L (ref <18)

## 2020-01-10 LAB — LACTIC ACID, PLASMA: Lactic Acid, Venous: 1.5 mmol/L (ref 0.5–1.9)

## 2020-01-10 MED ORDER — LORAZEPAM 2 MG PO TABS: 2.0000 mg | ORAL_TABLET | Freq: Once | ORAL | Status: DC

## 2020-01-10 NOTE — ED Provider Notes (Signed)
[facility] Emergency Department Provider Note   ____________________________________________   None    (approximate)  I have reviewed the triage vital signs and the nursing notes.   HISTORY  Chief Complaint Tremors and Headache    HPI Cassie Harrington is a 86 y.o. female patient comes in complaining of headache and aching all over not feeling well.  Patient comes from [facility] who reports patient usually walks without assistance and today was having difficulty walking was weak slurring her words and then began shaking all over.  Patient does have a history of Alzheimer's disease.  Patient reportedly had low blood pressure at the nursing home.  Patient denies any fever or coughing just says she aches all over.  She insists on wanting to go home over and over again but her daughter is with her and is able to talk her into staying for bed.        Past Medical History:  Diagnosis Date  . CAD (coronary artery disease)    Family hx  . Hyperlipidemia   . Palpitations    Tachy, minimized with low does beta blocker  . Rapid palpitations     Patient Active Problem List   Diagnosis Date Noted  . Shortness of breath 11/25/2011  . Abnormal EKG 11/25/2011  . HYPERLIPIDEMIA TYPE IIB / III 04/10/2009  . CAD, NATIVE VESSEL 04/10/2009  . CHEST PAIN, ATYPICAL 01/23/2009    Past Surgical History:  Procedure Laterality Date  . LITHOTRIPSY    . TONSILLECTOMY    . VESICOVAGINAL FISTULA CLOSURE W/ TAH      Prior to Admission medications   Medication Sig Start Date End Date Taking? Authorizing Provider  aspirin 81 MG EC tablet Take 1 tablet (81 mg total) by mouth at bedtime. 11/21/10   Wall, Marijo Conception, MD  Calcium Carbonate-Vitamin D (CALCIUM 600 + D PO) Take by mouth daily.    [provider]  cholecalciferol (VITAMIN D) 1000 UNITS tablet Take 1,000 Units by mouth daily.    [provider]  CRESTOR 10 MG tablet TAKE ONE TABLET AT BEDTIME  06/09/13   Minna Merritts, MD  levothyroxine (SYNTHROID, LEVOTHROID) 75 MCG tablet Take 1 tablet (75 mcg total) by mouth daily. 12/03/11   Wall, Marijo Conception, MD  metoprolol succinate (TOPROL-XL) 25 MG 24 hr tablet TAKE 1 TABLET EVERY DAY 06/09/13   Minna Merritts, MD  metoprolol succinate (TOPROL-XL) 25 MG 24 hr tablet Take 1 tablet (25 mg total) by mouth daily. 06/10/13   Minna Merritts, MD  rosuvastatin (CRESTOR) 10 MG tablet TAKE ONE TABLET AT BEDTIME 06/10/13   Minna Merritts, MD    Allergies Prednisone and Sulfonamide derivatives  Family History  Problem Relation Age of Onset  . Heart attack Brother 56       MI  . Coronary artery disease Other        family history    Social History Social History   Tobacco Use  . Smoking status: Never Smoker  . Smokeless tobacco: Never Used  Vaping Use  . Vaping Use: Never used  Substance Use Topics  . Alcohol use: No  . Drug use: No    Review of Systems  Constitutional: No fever she is having shaking chills Eyes: No visual changes. ENT: No sore throat. Cardiovascular: Denies chest pain. Respiratory: Denies shortness of breath. Gastrointestinal: No abdominal pain.  No nausea, no vomiting.  No diarrhea.  No constipation. Genitourinary: Negative for dysuria.  Musculoskeletal: Negative for back pain. Skin: Negative for rash. Neurological: Negative for focal weakness   ____________________________________________   PHYSICAL EXAM:  VITAL SIGNS: ED Triage Vitals  Enc Vitals Group     BP 01/10/20 0959 117/63     Pulse Rate 01/10/20 0959 63     Resp 01/10/20 0959 18     Temp 01/10/20 0959 98.4 F (36.9 C)     Temp Source 01/10/20 0959 Oral     SpO2 01/10/20 0959 100 %     Weight 01/10/20 1000 130 lb (59 kg)     Height 01/10/20 1000 5\' 3"  (1.6 m)     Head Circumference --      Peak Flow --      Pain Score --      Pain Loc --      Pain Edu? --      Excl. in [place]? --     Constitutional: Alert and oriented to person and  the fact that she is not home.  She is having shivers and shaking Eyes: Conjunctivae are normal. PER EOMI. Head: Atraumatic. Nose: No congestion/rhinnorhea. Mouth/Throat: Mucous membranes are moist.  Oropharynx non-erythematous. Neck: No stridor.  Supple. Cardiovascular: Normal rate, regular rhythm. Grossly normal heart sounds.  Good peripheral circulation. Respiratory: Normal respiratory effort.  No retractions. Lungs CTAB. Gastrointestinal: Soft and nontender. No distention. No abdominal bruits.  Musculoskeletal: No lower extremity tenderness nor edema.   Neurologic:  Normal speech and language. No gross focal neurologic deficits are appreciated.  Skin:  Skin is warm, dry and intact. No rash noted.   ____________________________________________   LABS (all labs ordered are listed, but only abnormal results are displayed)  Labs Reviewed  COMPREHENSIVE METABOLIC PANEL - Abnormal; Notable for the following components:      Result Value   Glucose, Bld 107 (*)    Total Bilirubin 1.3 (*)    All other components within normal limits  CBC WITH DIFFERENTIAL/PLATELET - Abnormal; Notable for the following components:   RBC 3.86 (*)    MCV 100.3 (*)    All other components within normal limits  SARS CORONAVIRUS 2 BY RT PCR (HOSPITAL ORDER, [facility])  GLUCOSE, CAPILLARY  LACTIC ACID, PLASMA  PROCALCITONIN  LACTIC ACID, PLASMA  URINALYSIS, COMPLETE (UACMP) WITH MICROSCOPIC  TROPONIN I (HIGH SENSITIVITY)  TROPONIN I (HIGH SENSITIVITY)   ____________________________________________  EKG   ____________________________________________  RADIOLOGY  ED MD interpretation: Chest x-ray is negative head CT shows minimal ethmoid sinus thickening  Official radiology report(s): DG Chest 2 View  Result Date: 01/10/2020 CLINICAL DATA:  Shaking chills and aching all over. EXAM: CHEST - 2 VIEW COMPARISON:  Prior chest radiograph 11/09/2019 and earlier FINDINGS:  Heart size within normal limits. Aortic atherosclerosis. No appreciable airspace consolidation or pulmonary edema. No evidence of pleural effusion or pneumothorax. No acute bony abnormality identified. IMPRESSION: No evidence of acute cardiopulmonary abnormality. Aortic Atherosclerosis (ICD10-I70.0). Electronically Signed   By: Kellie Simmering DO   On: 01/10/2020 11:34   CT Head Wo Contrast  Result Date: 01/10/2020 CLINICAL DATA:  Headache, intracranial hemorrhage suspected; shaking chills and aching all over. EXAM: CT HEAD WITHOUT CONTRAST TECHNIQUE: Contiguous axial images were obtained from the base of the skull through the vertex without intravenous contrast. COMPARISON:  No pertinent prior exams are available for comparison. FINDINGS: Brain: Moderate generalized parenchymal atrophy with associated prominence of ventricles and sulci. Moderate ill-defined hypoattenuation within the cerebral white matter is nonspecific, but consistent with chronic  small vessel ischemic disease. There is no acute intracranial hemorrhage. No demarcated cortical infarct. No extra-axial fluid collection. No evidence of intracranial mass. No midline shift. Vascular: No hyperdense vessel.  Atherosclerotic calcifications. Skull: Normal. Negative for fracture or focal suspicious lesion. Sinuses/Orbits: Visualized orbits show no acute finding. Trace ethmoid sinus mucosal thickening. No significant mastoid effusion at the imaged levels. IMPRESSION: No CT evidence of acute intracranial abnormality. Moderate generalized parenchymal atrophy and chronic small vessel ischemic disease. Minimal ethmoid sinus mucosal thickening. Electronically Signed   By: Kellie Simmering DO   On: 01/10/2020 11:31    ____________________________________________   PROCEDURES  Procedure(s) performed (including Critical Care):  Procedures   ____________________________________________   INITIAL IMPRESSION / ASSESSMENT AND PLAN / ED COURSE  Patient CBC  urinalysis chest x-ray head CT and procalcitonin and coronavirus test are all essentially negative her vitals are stable she has not had a fever she has good oxygen saturations.  She feels well now.  Her daughter feels that she is back to normal.  She is not shaking any longer.  She never had a fever.  Daughter and the patient both now want to go back home.  I will let them do so.  Patient can always return if she gets worse at all. Patient no longer has a headache or body aches.            ____________________________________________   FINAL CLINICAL IMPRESSION(S) / ED DIAGNOSES  Final diagnoses:  Nonintractable headache, unspecified chronicity pattern, unspecified headache type  Shivering     ED Discharge Orders    None       Note:  This document was prepared using Dragon voice recognition software and may include unintentional dictation errors.    Nena Polio, MD 01/10/20 1251

## 2020-01-10 NOTE — ED Triage Notes (Addendum)
Pt from [facility] [place] (memory care) via [employer]. Pt presents with hx of alzheimer's.  Per daughter [facility] RN st pt's  BP was "low and she was shaking and that is all I know".   According to [place] transfer report from [facility]-  "uncontrolled shaking and c/o not feeling well".   This RN contacted [facility], this RN spoke with Collier Salina who st pt walks w/o assistance and pt having difficulty walking, weakness, slurring words this morning and assisted back to bed and tremors began.  Pt alert but not "respnding" pt st "I don't feel well".   LNW 8am.   Tremor noted on left arm. BI equal grip strength.

## 2020-01-10 NOTE — Discharge Instructions (Addendum)
Please return for any further problems or fever or return of headache etc.  The test that we did here today the CBC electrolytes chest x-ray urinalysis procalcitonin etc. are all within normal limits.  CT scan of the head only showed some mild ethmoid sinus thickening but since she does not have any more symptoms including the headache and you both feel comfortable going back home I will not pursue this.  It may just be a mild viral infection.  The Covid test was negative.

## 2020-01-10 NOTE — ED Triage Notes (Signed)
Pt in via EMS from [facility] memory care. Pt with dementia. EMS reports at 8:45 pt began to have tremors in arms uncontrollable. VS WNL, 95% RA, 118/70, HR 68, FSBS 121

## 2020-01-18 DIAGNOSIS — N390 Urinary tract infection, site not specified: Secondary | ICD-10-CM | POA: Diagnosis not present

## 2020-01-18 DIAGNOSIS — G4089 Other seizures: Secondary | ICD-10-CM | POA: Diagnosis not present

## 2020-01-18 DIAGNOSIS — E782 Mixed hyperlipidemia: Secondary | ICD-10-CM | POA: Diagnosis not present

## 2020-01-19 ENCOUNTER — Emergency Department
Admission: EM | Admit: 2020-01-19 | Discharge: 2020-01-19 | Disposition: A | Discharge status: Skilled Nursing Facility | Payer: Medicare PPO | Attending: Emergency Medicine | Admitting: Emergency Medicine

## 2020-01-19 ENCOUNTER — Encounter: Payer: Self-pay | Admitting: Emergency Medicine

## 2020-01-19 ENCOUNTER — Other Ambulatory Visit: Payer: Self-pay

## 2020-01-19 DIAGNOSIS — R279 Unspecified lack of coordination: Secondary | ICD-10-CM | POA: Diagnosis not present

## 2020-01-19 DIAGNOSIS — Z79899 Other long term (current) drug therapy: Secondary | ICD-10-CM | POA: Diagnosis not present

## 2020-01-19 DIAGNOSIS — I2510 Atherosclerotic heart disease of native coronary artery without angina pectoris: Secondary | ICD-10-CM | POA: Insufficient documentation

## 2020-01-19 DIAGNOSIS — R001 Bradycardia, unspecified: Secondary | ICD-10-CM | POA: Diagnosis not present

## 2020-01-19 DIAGNOSIS — Z743 Need for continuous supervision: Secondary | ICD-10-CM | POA: Diagnosis not present

## 2020-01-19 DIAGNOSIS — F0390 Unspecified dementia without behavioral disturbance: Secondary | ICD-10-CM | POA: Insufficient documentation

## 2020-01-19 DIAGNOSIS — R55 Syncope and collapse: Secondary | ICD-10-CM | POA: Diagnosis not present

## 2020-01-19 DIAGNOSIS — I959 Hypotension, unspecified: Secondary | ICD-10-CM | POA: Diagnosis not present

## 2020-01-19 DIAGNOSIS — Z7982 Long term (current) use of aspirin: Secondary | ICD-10-CM | POA: Diagnosis not present

## 2020-01-19 LAB — BASIC METABOLIC PANEL
Anion gap: 9 (ref 5–15)
BUN: 12 mg/dL (ref 8–23)
CO2: 28 mmol/L (ref 22–32)
Calcium: 9.3 mg/dL (ref 8.9–10.3)
Chloride: 101 mmol/L (ref 98–111)
Creatinine, Ser: 0.84 mg/dL (ref 0.44–1.00)
GFR calc Af Amer: >60 mL/min (ref >60)
GFR calc non Af Amer: >60 mL/min (ref >60)
Glucose, Bld: 98 mg/dL (ref 70–99)
Potassium: 4.1 mmol/L (ref 3.5–5.1)
Sodium: 138 mmol/L (ref 135–145)

## 2020-01-19 LAB — CBC
HCT: 37.8 % (ref 36.0–46.0)
Hemoglobin: 12.9 g/dL (ref 12.0–15.0)
MCH: 33.5 pg (ref 26.0–34.0)
MCHC: 34.1 g/dL (ref 30.0–36.0)
MCV: 98.2 fL (ref 80.0–100.0)
Platelets: 251 10*3/uL (ref 150–400)
RBC: 3.85 MIL/uL — ABNORMAL LOW (ref 3.87–5.11)
RDW: 13.5 % (ref 11.5–15.5)
WBC: 8.2 10*3/uL (ref 4.0–10.5)
nRBC: 0 % (ref 0.0–0.2)

## 2020-01-19 LAB — TROPONIN I (HIGH SENSITIVITY): Troponin I (High Sensitivity): 3 ng/L (ref <18)

## 2020-01-19 LAB — GLUCOSE, CAPILLARY: Glucose-Capillary: 82 mg/dL (ref 70–99)

## 2020-01-19 NOTE — ED Notes (Signed)
Called Denyce Robert, RN [phone] from [facility] and updated her on patient stay

## 2020-01-19 NOTE — Discharge Instructions (Addendum)
Please seek medical attention for any high fevers, chest pain, shortness of breath, change in behavior, persistent vomiting, bloody stool or any other new or concerning symptoms.  

## 2020-01-19 NOTE — ED Notes (Signed)
Spoke with patients daughter Vinnie Level and said that patient has begun shaking and has several times loss consciousness mainly in the morning times and no longer than 20 seconds. Patients primary care doctor does not understand where the shaking is coming from. Vinnie Level will not be able to come at this time due to [place] in the hospital and she is keeping her 81 month old grandson

## 2020-01-19 NOTE — ED Notes (Signed)
Patient will not stay in bed, she continues to holler help and that she wants to go home Charge RN Marya Amsler notified

## 2020-01-19 NOTE — ED Notes (Signed)
This RN and RN Mimi attempted to obtain a UA from pt via walking to the toliet and via In and Out cath- both to which were not successful EDP notified. Pt not in any acute distress and repeating that she just wishes to go home.

## 2020-01-19 NOTE — ED Notes (Signed)
Patient will not keep her EKG stickers on, she is refusing to let nurse get an IV and dressing her out

## 2020-01-19 NOTE — ED Notes (Signed)
RN Orson Slick was given report about the pt in regards to the EDP's disposition and further recommendations. RN Orson Slick from [place] unit in agreement with the pt's return to the facility. No further questions posed to this RN at this time.

## 2020-01-19 NOTE — ED Triage Notes (Signed)
Pt comes into the ED via EMS from [facility] with c/o multiple syncopal episodes per staff. 123/61, HR68, 99-100%RA, CBG 114. 97.6 temp, pt is yelling out in the [place] to other patients asking them to take her home.

## 2020-01-19 NOTE — ED Notes (Addendum)
Per safety sitter- pt up to the bathroom without any assistance at this time

## 2020-01-19 NOTE — ED Triage Notes (Signed)
See First Nurse Note.  Pt with advanced dementia at baseline, currently oriented to self only, asking, "Can I go home now?"  Vitals WDL.  NAD noted at this time.

## 2020-01-19 NOTE — ED Provider Notes (Signed)
[facility] Emergency Department Provider Note ____________________________________________   I have reviewed the triage vital signs and the nursing notes.   HISTORY  Chief Complaint Syncope  History limited by and level 5 caveat due to: Dementia   HPI Cassie Harrington is a 86 y.o. female who presents to the emergency department today from living facility because of concern for syncopal episodes. The patient herself cannot give any history. She simply states she would like to go home. Per chart review the patient was seen in the emergency department a few months ago for a syncopal episode with negative work up at that time.    Records reviewed. Per medical record review patient has a history of CAD, HLD.   Past Medical History:  Diagnosis Date  . CAD (coronary artery disease)    Family hx  . Hyperlipidemia   . Palpitations    Tachy, minimized with low does beta blocker  . Rapid palpitations     Patient Active Problem List   Diagnosis Date Noted  . Shortness of breath 11/25/2011  . Abnormal EKG 11/25/2011  . HYPERLIPIDEMIA TYPE IIB / III 04/10/2009  . CAD, NATIVE VESSEL 04/10/2009  . CHEST PAIN, ATYPICAL 01/23/2009    Past Surgical History:  Procedure Laterality Date  . LITHOTRIPSY    . TONSILLECTOMY    . VESICOVAGINAL FISTULA CLOSURE W/ TAH      Prior to Admission medications   Medication Sig Start Date End Date Taking? Authorizing Provider  aspirin 81 MG EC tablet Take 1 tablet (81 mg total) by mouth at bedtime. 11/21/10   Wall, Marijo Conception, MD  Calcium Carbonate-Vitamin D (CALCIUM 600 + D PO) Take by mouth daily.    [provider]  cholecalciferol (VITAMIN D) 1000 UNITS tablet Take 1,000 Units by mouth daily.    [provider]  CRESTOR 10 MG tablet TAKE ONE TABLET AT BEDTIME 06/09/13   Minna Merritts, MD  levothyroxine (SYNTHROID, LEVOTHROID) 75 MCG tablet Take 1 tablet (75 mcg total) by mouth daily. 12/03/11   Wall, Marijo Conception, MD  metoprolol succinate (TOPROL-XL) 25 MG 24 hr tablet TAKE 1 TABLET EVERY DAY 06/09/13   Minna Merritts, MD  metoprolol succinate (TOPROL-XL) 25 MG 24 hr tablet Take 1 tablet (25 mg total) by mouth daily. 06/10/13   Minna Merritts, MD  rosuvastatin (CRESTOR) 10 MG tablet TAKE ONE TABLET AT BEDTIME 06/10/13   Minna Merritts, MD    Allergies Prednisone and Sulfonamide derivatives  Family History  Problem Relation Age of Onset  . Heart attack Brother 78       MI  . Coronary artery disease Other        family history    Social History Social History   Tobacco Use  . Smoking status: Never Smoker  . Smokeless tobacco: Never Used  Vaping Use  . Vaping Use: Never used  Substance Use Topics  . Alcohol use: No  . Drug use: No    Review of Systems Unable to obtain reliable ROS secondary to dementia.  ____________________________________________   PHYSICAL EXAM:  VITAL SIGNS: ED Triage Vitals  Enc Vitals Group     BP 01/19/20 1244 108/61     Pulse Rate 01/19/20 1244 61     Resp 01/19/20 1244 16     Temp 01/19/20 1244 98.2 F (36.8 C)     Temp Source 01/19/20 1244 Oral     SpO2 01/19/20 1244 99 %  Weight 01/19/20 1245 140 lb (63.5 kg)     Height 01/19/20 1245 5\' 6"  (1.676 m)   Constitutional: Awake and alert.  Eyes: Conjunctivae are normal.  ENT      Head: Normocephalic and atraumatic.      Nose: No congestion/rhinnorhea.      Mouth/Throat: Mucous membranes are moist.      Neck: No stridor. Hematological/Lymphatic/Immunilogical: No cervical lymphadenopathy. Cardiovascular: Normal rate, regular rhythm.  No murmurs, rubs, or gallops.  Respiratory: Normal respiratory effort without tachypnea nor retractions. Breath sounds are clear and equal bilaterally. No wheezes/rales/rhonchi. Gastrointestinal: Soft and non tender. No rebound. No guarding.  Genitourinary: Deferred Musculoskeletal: Normal range of motion in all extremities. No lower extremity  edema. Neurologic:  Dementia. Moving all extremities. Skin:  Skin is warm, dry and intact. No rash noted. ____________________________________________    LABS (pertinent positives/negatives)  BMP wnl CBC wbc 8.2, hgb 12.9, plt 251 Trop hs 3 ____________________________________________   EKG  I, Nance Pear, attending physician, personally viewed and interpreted this EKG  EKG Time: 1245 Rate: 67 Rhythm: normal sinus rhythm Axis: normal Intervals: qtc 439 QRS: narrow, q waves V1, v2, v3 ST changes: no st elevation Impression: abnormal ekg  ____________________________________________    RADIOLOGY  None  ____________________________________________   PROCEDURES  Procedures  ____________________________________________   INITIAL IMPRESSION / ASSESSMENT AND PLAN / ED COURSE  Pertinent labs & imaging results that were available during my care of the patient were reviewed by me and considered in my medical decision making (see chart for details).   Patient presented to the emergency department today after having a syncopal episode.  Blood work without concerning abnormalities.  Patient has had similar episodes in the past.  Urine had initially been ordered however given the lack of any symptoms reported I have low suspicion for urinary tract infection.  Additionally when she had a seizure in the past urine was without concerning findings.  This time do think is reasonable to discharge patient back to living facility.  ____________________________________________   FINAL CLINICAL IMPRESSION(S) / ED DIAGNOSES  Final diagnoses:  Syncope, unspecified syncope type     Note: This dictation was prepared with Dragon dictation. Any transcriptional errors that result from this process are unintentional     Nance Pear, MD 01/19/20 2227

## 2020-01-23 DIAGNOSIS — F39 Unspecified mood [affective] disorder: Secondary | ICD-10-CM | POA: Diagnosis not present

## 2020-01-23 DIAGNOSIS — R55 Syncope and collapse: Secondary | ICD-10-CM | POA: Diagnosis not present

## 2020-01-25 DIAGNOSIS — I951 Orthostatic hypotension: Secondary | ICD-10-CM | POA: Diagnosis not present

## 2020-01-25 DIAGNOSIS — E43 Unspecified severe protein-calorie malnutrition: Secondary | ICD-10-CM | POA: Diagnosis not present

## 2020-01-25 DIAGNOSIS — I2510 Atherosclerotic heart disease of native coronary artery without angina pectoris: Secondary | ICD-10-CM | POA: Diagnosis not present

## 2020-01-25 DIAGNOSIS — G309 Alzheimer's disease, unspecified: Secondary | ICD-10-CM | POA: Diagnosis not present

## 2020-01-25 DIAGNOSIS — K219 Gastro-esophageal reflux disease without esophagitis: Secondary | ICD-10-CM | POA: Diagnosis not present

## 2020-01-25 DIAGNOSIS — E039 Hypothyroidism, unspecified: Secondary | ICD-10-CM | POA: Diagnosis not present

## 2020-02-03 DIAGNOSIS — B351 Tinea unguium: Secondary | ICD-10-CM | POA: Diagnosis not present

## 2020-03-19 DIAGNOSIS — D519 Vitamin B12 deficiency anemia, unspecified: Secondary | ICD-10-CM | POA: Diagnosis not present

## 2020-03-19 DIAGNOSIS — E039 Hypothyroidism, unspecified: Secondary | ICD-10-CM | POA: Diagnosis not present

## 2020-05-16 DIAGNOSIS — F39 Unspecified mood [affective] disorder: Secondary | ICD-10-CM | POA: Diagnosis not present

## 2020-05-16 DIAGNOSIS — M1990 Unspecified osteoarthritis, unspecified site: Secondary | ICD-10-CM | POA: Diagnosis not present

## 2020-05-16 DIAGNOSIS — E039 Hypothyroidism, unspecified: Secondary | ICD-10-CM | POA: Diagnosis not present

## 2020-05-16 DIAGNOSIS — G309 Alzheimer's disease, unspecified: Secondary | ICD-10-CM | POA: Diagnosis not present

## 2020-05-16 DIAGNOSIS — N183 Chronic kidney disease, stage 3 unspecified: Secondary | ICD-10-CM | POA: Diagnosis not present

## 2020-05-16 DIAGNOSIS — I2510 Atherosclerotic heart disease of native coronary artery without angina pectoris: Secondary | ICD-10-CM | POA: Diagnosis not present

## 2020-05-16 DIAGNOSIS — K219 Gastro-esophageal reflux disease without esophagitis: Secondary | ICD-10-CM | POA: Diagnosis not present

## 2020-05-16 DIAGNOSIS — I951 Orthostatic hypotension: Secondary | ICD-10-CM | POA: Diagnosis not present

## 2020-05-30 DIAGNOSIS — R2681 Unsteadiness on feet: Secondary | ICD-10-CM | POA: Diagnosis not present

## 2020-05-30 DIAGNOSIS — G3184 Mild cognitive impairment, so stated: Secondary | ICD-10-CM | POA: Diagnosis not present

## 2020-05-30 DIAGNOSIS — I2510 Atherosclerotic heart disease of native coronary artery without angina pectoris: Secondary | ICD-10-CM | POA: Diagnosis not present

## 2020-05-30 DIAGNOSIS — R262 Difficulty in walking, not elsewhere classified: Secondary | ICD-10-CM | POA: Diagnosis not present

## 2020-05-30 DIAGNOSIS — G301 Alzheimer's disease with late onset: Secondary | ICD-10-CM | POA: Diagnosis not present

## 2020-05-30 DIAGNOSIS — F0280 Dementia in other diseases classified elsewhere without behavioral disturbance: Secondary | ICD-10-CM | POA: Diagnosis not present

## 2020-06-04 DIAGNOSIS — R262 Difficulty in walking, not elsewhere classified: Secondary | ICD-10-CM | POA: Diagnosis not present

## 2020-06-04 DIAGNOSIS — G3184 Mild cognitive impairment, so stated: Secondary | ICD-10-CM | POA: Diagnosis not present

## 2020-06-04 DIAGNOSIS — R2681 Unsteadiness on feet: Secondary | ICD-10-CM | POA: Diagnosis not present

## 2020-06-04 DIAGNOSIS — F0280 Dementia in other diseases classified elsewhere without behavioral disturbance: Secondary | ICD-10-CM | POA: Diagnosis not present

## 2020-06-04 DIAGNOSIS — G301 Alzheimer's disease with late onset: Secondary | ICD-10-CM | POA: Diagnosis not present

## 2020-06-04 DIAGNOSIS — I2510 Atherosclerotic heart disease of native coronary artery without angina pectoris: Secondary | ICD-10-CM | POA: Diagnosis not present

## 2020-06-06 DIAGNOSIS — F0280 Dementia in other diseases classified elsewhere without behavioral disturbance: Secondary | ICD-10-CM | POA: Diagnosis not present

## 2020-06-06 DIAGNOSIS — G3184 Mild cognitive impairment, so stated: Secondary | ICD-10-CM | POA: Diagnosis not present

## 2020-06-06 DIAGNOSIS — R262 Difficulty in walking, not elsewhere classified: Secondary | ICD-10-CM | POA: Diagnosis not present

## 2020-06-06 DIAGNOSIS — R2681 Unsteadiness on feet: Secondary | ICD-10-CM | POA: Diagnosis not present

## 2020-06-06 DIAGNOSIS — I2510 Atherosclerotic heart disease of native coronary artery without angina pectoris: Secondary | ICD-10-CM | POA: Diagnosis not present

## 2020-06-06 DIAGNOSIS — G301 Alzheimer's disease with late onset: Secondary | ICD-10-CM | POA: Diagnosis not present

## 2020-06-08 DIAGNOSIS — F0280 Dementia in other diseases classified elsewhere without behavioral disturbance: Secondary | ICD-10-CM | POA: Diagnosis not present

## 2020-06-08 DIAGNOSIS — G3184 Mild cognitive impairment, so stated: Secondary | ICD-10-CM | POA: Diagnosis not present

## 2020-06-08 DIAGNOSIS — I2510 Atherosclerotic heart disease of native coronary artery without angina pectoris: Secondary | ICD-10-CM | POA: Diagnosis not present

## 2020-06-08 DIAGNOSIS — R2681 Unsteadiness on feet: Secondary | ICD-10-CM | POA: Diagnosis not present

## 2020-06-08 DIAGNOSIS — G301 Alzheimer's disease with late onset: Secondary | ICD-10-CM | POA: Diagnosis not present

## 2020-06-08 DIAGNOSIS — R262 Difficulty in walking, not elsewhere classified: Secondary | ICD-10-CM | POA: Diagnosis not present

## 2020-06-11 DIAGNOSIS — R262 Difficulty in walking, not elsewhere classified: Secondary | ICD-10-CM | POA: Diagnosis not present

## 2020-06-11 DIAGNOSIS — R2681 Unsteadiness on feet: Secondary | ICD-10-CM | POA: Diagnosis not present

## 2020-06-11 DIAGNOSIS — F0280 Dementia in other diseases classified elsewhere without behavioral disturbance: Secondary | ICD-10-CM | POA: Diagnosis not present

## 2020-06-11 DIAGNOSIS — G301 Alzheimer's disease with late onset: Secondary | ICD-10-CM | POA: Diagnosis not present

## 2020-06-11 DIAGNOSIS — G3184 Mild cognitive impairment, so stated: Secondary | ICD-10-CM | POA: Diagnosis not present

## 2020-06-11 DIAGNOSIS — I2510 Atherosclerotic heart disease of native coronary artery without angina pectoris: Secondary | ICD-10-CM | POA: Diagnosis not present

## 2020-06-15 DIAGNOSIS — R2681 Unsteadiness on feet: Secondary | ICD-10-CM | POA: Diagnosis not present

## 2020-06-15 DIAGNOSIS — G3184 Mild cognitive impairment, so stated: Secondary | ICD-10-CM | POA: Diagnosis not present

## 2020-06-15 DIAGNOSIS — I2510 Atherosclerotic heart disease of native coronary artery without angina pectoris: Secondary | ICD-10-CM | POA: Diagnosis not present

## 2020-06-15 DIAGNOSIS — R262 Difficulty in walking, not elsewhere classified: Secondary | ICD-10-CM | POA: Diagnosis not present

## 2020-06-15 DIAGNOSIS — F0280 Dementia in other diseases classified elsewhere without behavioral disturbance: Secondary | ICD-10-CM | POA: Diagnosis not present

## 2020-06-15 DIAGNOSIS — G301 Alzheimer's disease with late onset: Secondary | ICD-10-CM | POA: Diagnosis not present

## 2020-06-18 DIAGNOSIS — I2510 Atherosclerotic heart disease of native coronary artery without angina pectoris: Secondary | ICD-10-CM | POA: Diagnosis not present

## 2020-06-18 DIAGNOSIS — R2681 Unsteadiness on feet: Secondary | ICD-10-CM | POA: Diagnosis not present

## 2020-06-18 DIAGNOSIS — G3184 Mild cognitive impairment, so stated: Secondary | ICD-10-CM | POA: Diagnosis not present

## 2020-06-18 DIAGNOSIS — F0280 Dementia in other diseases classified elsewhere without behavioral disturbance: Secondary | ICD-10-CM | POA: Diagnosis not present

## 2020-06-18 DIAGNOSIS — R262 Difficulty in walking, not elsewhere classified: Secondary | ICD-10-CM | POA: Diagnosis not present

## 2020-06-18 DIAGNOSIS — G301 Alzheimer's disease with late onset: Secondary | ICD-10-CM | POA: Diagnosis not present

## 2020-06-20 DIAGNOSIS — G3184 Mild cognitive impairment, so stated: Secondary | ICD-10-CM | POA: Diagnosis not present

## 2020-06-20 DIAGNOSIS — G301 Alzheimer's disease with late onset: Secondary | ICD-10-CM | POA: Diagnosis not present

## 2020-06-20 DIAGNOSIS — I2510 Atherosclerotic heart disease of native coronary artery without angina pectoris: Secondary | ICD-10-CM | POA: Diagnosis not present

## 2020-06-20 DIAGNOSIS — R262 Difficulty in walking, not elsewhere classified: Secondary | ICD-10-CM | POA: Diagnosis not present

## 2020-06-20 DIAGNOSIS — F0280 Dementia in other diseases classified elsewhere without behavioral disturbance: Secondary | ICD-10-CM | POA: Diagnosis not present

## 2020-06-20 DIAGNOSIS — R2681 Unsteadiness on feet: Secondary | ICD-10-CM | POA: Diagnosis not present

## 2020-06-22 DIAGNOSIS — I2510 Atherosclerotic heart disease of native coronary artery without angina pectoris: Secondary | ICD-10-CM | POA: Diagnosis not present

## 2020-06-22 DIAGNOSIS — G3184 Mild cognitive impairment, so stated: Secondary | ICD-10-CM | POA: Diagnosis not present

## 2020-06-22 DIAGNOSIS — R262 Difficulty in walking, not elsewhere classified: Secondary | ICD-10-CM | POA: Diagnosis not present

## 2020-06-22 DIAGNOSIS — F0280 Dementia in other diseases classified elsewhere without behavioral disturbance: Secondary | ICD-10-CM | POA: Diagnosis not present

## 2020-06-22 DIAGNOSIS — G301 Alzheimer's disease with late onset: Secondary | ICD-10-CM | POA: Diagnosis not present

## 2020-06-22 DIAGNOSIS — R2681 Unsteadiness on feet: Secondary | ICD-10-CM | POA: Diagnosis not present

## 2020-06-25 DIAGNOSIS — I2510 Atherosclerotic heart disease of native coronary artery without angina pectoris: Secondary | ICD-10-CM | POA: Diagnosis not present

## 2020-06-25 DIAGNOSIS — R2681 Unsteadiness on feet: Secondary | ICD-10-CM | POA: Diagnosis not present

## 2020-06-25 DIAGNOSIS — G3184 Mild cognitive impairment, so stated: Secondary | ICD-10-CM | POA: Diagnosis not present

## 2020-06-25 DIAGNOSIS — R262 Difficulty in walking, not elsewhere classified: Secondary | ICD-10-CM | POA: Diagnosis not present

## 2020-06-25 DIAGNOSIS — G301 Alzheimer's disease with late onset: Secondary | ICD-10-CM | POA: Diagnosis not present

## 2020-06-25 DIAGNOSIS — F0280 Dementia in other diseases classified elsewhere without behavioral disturbance: Secondary | ICD-10-CM | POA: Diagnosis not present

## 2020-06-27 DIAGNOSIS — F0280 Dementia in other diseases classified elsewhere without behavioral disturbance: Secondary | ICD-10-CM | POA: Diagnosis not present

## 2020-06-27 DIAGNOSIS — R2681 Unsteadiness on feet: Secondary | ICD-10-CM | POA: Diagnosis not present

## 2020-06-27 DIAGNOSIS — R262 Difficulty in walking, not elsewhere classified: Secondary | ICD-10-CM | POA: Diagnosis not present

## 2020-06-27 DIAGNOSIS — G301 Alzheimer's disease with late onset: Secondary | ICD-10-CM | POA: Diagnosis not present

## 2020-06-27 DIAGNOSIS — I2510 Atherosclerotic heart disease of native coronary artery without angina pectoris: Secondary | ICD-10-CM | POA: Diagnosis not present

## 2020-06-27 DIAGNOSIS — G3184 Mild cognitive impairment, so stated: Secondary | ICD-10-CM | POA: Diagnosis not present

## 2020-06-29 DIAGNOSIS — R262 Difficulty in walking, not elsewhere classified: Secondary | ICD-10-CM | POA: Diagnosis not present

## 2020-06-29 DIAGNOSIS — F0280 Dementia in other diseases classified elsewhere without behavioral disturbance: Secondary | ICD-10-CM | POA: Diagnosis not present

## 2020-06-29 DIAGNOSIS — I2510 Atherosclerotic heart disease of native coronary artery without angina pectoris: Secondary | ICD-10-CM | POA: Diagnosis not present

## 2020-06-29 DIAGNOSIS — G3184 Mild cognitive impairment, so stated: Secondary | ICD-10-CM | POA: Diagnosis not present

## 2020-06-29 DIAGNOSIS — G301 Alzheimer's disease with late onset: Secondary | ICD-10-CM | POA: Diagnosis not present

## 2020-06-29 DIAGNOSIS — R2681 Unsteadiness on feet: Secondary | ICD-10-CM | POA: Diagnosis not present

## 2020-07-02 DIAGNOSIS — I2510 Atherosclerotic heart disease of native coronary artery without angina pectoris: Secondary | ICD-10-CM | POA: Diagnosis not present

## 2020-07-02 DIAGNOSIS — R262 Difficulty in walking, not elsewhere classified: Secondary | ICD-10-CM | POA: Diagnosis not present

## 2020-07-02 DIAGNOSIS — G3184 Mild cognitive impairment, so stated: Secondary | ICD-10-CM | POA: Diagnosis not present

## 2020-07-02 DIAGNOSIS — F0280 Dementia in other diseases classified elsewhere without behavioral disturbance: Secondary | ICD-10-CM | POA: Diagnosis not present

## 2020-07-02 DIAGNOSIS — G301 Alzheimer's disease with late onset: Secondary | ICD-10-CM | POA: Diagnosis not present

## 2020-07-02 DIAGNOSIS — R2681 Unsteadiness on feet: Secondary | ICD-10-CM | POA: Diagnosis not present

## 2020-07-04 DIAGNOSIS — R262 Difficulty in walking, not elsewhere classified: Secondary | ICD-10-CM | POA: Diagnosis not present

## 2020-07-04 DIAGNOSIS — G3184 Mild cognitive impairment, so stated: Secondary | ICD-10-CM | POA: Diagnosis not present

## 2020-07-04 DIAGNOSIS — G301 Alzheimer's disease with late onset: Secondary | ICD-10-CM | POA: Diagnosis not present

## 2020-07-04 DIAGNOSIS — F0280 Dementia in other diseases classified elsewhere without behavioral disturbance: Secondary | ICD-10-CM | POA: Diagnosis not present

## 2020-07-04 DIAGNOSIS — I2510 Atherosclerotic heart disease of native coronary artery without angina pectoris: Secondary | ICD-10-CM | POA: Diagnosis not present

## 2020-07-04 DIAGNOSIS — R2681 Unsteadiness on feet: Secondary | ICD-10-CM | POA: Diagnosis not present

## 2020-07-11 DIAGNOSIS — F0280 Dementia in other diseases classified elsewhere without behavioral disturbance: Secondary | ICD-10-CM | POA: Diagnosis not present

## 2020-07-11 DIAGNOSIS — G301 Alzheimer's disease with late onset: Secondary | ICD-10-CM | POA: Diagnosis not present

## 2020-07-11 DIAGNOSIS — R2681 Unsteadiness on feet: Secondary | ICD-10-CM | POA: Diagnosis not present

## 2020-07-11 DIAGNOSIS — I2510 Atherosclerotic heart disease of native coronary artery without angina pectoris: Secondary | ICD-10-CM | POA: Diagnosis not present

## 2020-07-11 DIAGNOSIS — G3184 Mild cognitive impairment, so stated: Secondary | ICD-10-CM | POA: Diagnosis not present

## 2020-07-11 DIAGNOSIS — R262 Difficulty in walking, not elsewhere classified: Secondary | ICD-10-CM | POA: Diagnosis not present

## 2020-07-12 DIAGNOSIS — N1831 Chronic kidney disease, stage 3a: Secondary | ICD-10-CM | POA: Diagnosis not present

## 2020-07-12 DIAGNOSIS — I951 Orthostatic hypotension: Secondary | ICD-10-CM | POA: Diagnosis not present

## 2020-07-12 DIAGNOSIS — G301 Alzheimer's disease with late onset: Secondary | ICD-10-CM | POA: Diagnosis not present

## 2020-07-12 DIAGNOSIS — I2510 Atherosclerotic heart disease of native coronary artery without angina pectoris: Secondary | ICD-10-CM | POA: Diagnosis not present

## 2020-07-12 DIAGNOSIS — F39 Unspecified mood [affective] disorder: Secondary | ICD-10-CM | POA: Diagnosis not present

## 2020-07-13 DIAGNOSIS — G3184 Mild cognitive impairment, so stated: Secondary | ICD-10-CM | POA: Diagnosis not present

## 2020-07-13 DIAGNOSIS — R262 Difficulty in walking, not elsewhere classified: Secondary | ICD-10-CM | POA: Diagnosis not present

## 2020-07-13 DIAGNOSIS — I2510 Atherosclerotic heart disease of native coronary artery without angina pectoris: Secondary | ICD-10-CM | POA: Diagnosis not present

## 2020-07-13 DIAGNOSIS — G301 Alzheimer's disease with late onset: Secondary | ICD-10-CM | POA: Diagnosis not present

## 2020-07-13 DIAGNOSIS — F0280 Dementia in other diseases classified elsewhere without behavioral disturbance: Secondary | ICD-10-CM | POA: Diagnosis not present

## 2020-07-13 DIAGNOSIS — R2681 Unsteadiness on feet: Secondary | ICD-10-CM | POA: Diagnosis not present

## 2020-07-16 DIAGNOSIS — I2510 Atherosclerotic heart disease of native coronary artery without angina pectoris: Secondary | ICD-10-CM | POA: Diagnosis not present

## 2020-07-16 DIAGNOSIS — R262 Difficulty in walking, not elsewhere classified: Secondary | ICD-10-CM | POA: Diagnosis not present

## 2020-07-16 DIAGNOSIS — R2681 Unsteadiness on feet: Secondary | ICD-10-CM | POA: Diagnosis not present

## 2020-07-16 DIAGNOSIS — G3184 Mild cognitive impairment, so stated: Secondary | ICD-10-CM | POA: Diagnosis not present

## 2020-07-16 DIAGNOSIS — G301 Alzheimer's disease with late onset: Secondary | ICD-10-CM | POA: Diagnosis not present

## 2020-07-16 DIAGNOSIS — F0280 Dementia in other diseases classified elsewhere without behavioral disturbance: Secondary | ICD-10-CM | POA: Diagnosis not present

## 2020-07-18 DIAGNOSIS — I2510 Atherosclerotic heart disease of native coronary artery without angina pectoris: Secondary | ICD-10-CM | POA: Diagnosis not present

## 2020-07-18 DIAGNOSIS — R262 Difficulty in walking, not elsewhere classified: Secondary | ICD-10-CM | POA: Diagnosis not present

## 2020-07-18 DIAGNOSIS — F0280 Dementia in other diseases classified elsewhere without behavioral disturbance: Secondary | ICD-10-CM | POA: Diagnosis not present

## 2020-07-18 DIAGNOSIS — G3184 Mild cognitive impairment, so stated: Secondary | ICD-10-CM | POA: Diagnosis not present

## 2020-07-18 DIAGNOSIS — R2681 Unsteadiness on feet: Secondary | ICD-10-CM | POA: Diagnosis not present

## 2020-07-18 DIAGNOSIS — G301 Alzheimer's disease with late onset: Secondary | ICD-10-CM | POA: Diagnosis not present

## 2020-07-20 DIAGNOSIS — I2510 Atherosclerotic heart disease of native coronary artery without angina pectoris: Secondary | ICD-10-CM | POA: Diagnosis not present

## 2020-07-20 DIAGNOSIS — R262 Difficulty in walking, not elsewhere classified: Secondary | ICD-10-CM | POA: Diagnosis not present

## 2020-07-20 DIAGNOSIS — G301 Alzheimer's disease with late onset: Secondary | ICD-10-CM | POA: Diagnosis not present

## 2020-07-20 DIAGNOSIS — G3184 Mild cognitive impairment, so stated: Secondary | ICD-10-CM | POA: Diagnosis not present

## 2020-07-20 DIAGNOSIS — F0280 Dementia in other diseases classified elsewhere without behavioral disturbance: Secondary | ICD-10-CM | POA: Diagnosis not present

## 2020-07-20 DIAGNOSIS — R2681 Unsteadiness on feet: Secondary | ICD-10-CM | POA: Diagnosis not present

## 2020-07-23 DIAGNOSIS — G301 Alzheimer's disease with late onset: Secondary | ICD-10-CM | POA: Diagnosis not present

## 2020-07-23 DIAGNOSIS — R262 Difficulty in walking, not elsewhere classified: Secondary | ICD-10-CM | POA: Diagnosis not present

## 2020-07-23 DIAGNOSIS — G3184 Mild cognitive impairment, so stated: Secondary | ICD-10-CM | POA: Diagnosis not present

## 2020-07-23 DIAGNOSIS — F0280 Dementia in other diseases classified elsewhere without behavioral disturbance: Secondary | ICD-10-CM | POA: Diagnosis not present

## 2020-07-23 DIAGNOSIS — R2681 Unsteadiness on feet: Secondary | ICD-10-CM | POA: Diagnosis not present

## 2020-07-23 DIAGNOSIS — I2510 Atherosclerotic heart disease of native coronary artery without angina pectoris: Secondary | ICD-10-CM | POA: Diagnosis not present

## 2020-07-25 DIAGNOSIS — G3184 Mild cognitive impairment, so stated: Secondary | ICD-10-CM | POA: Diagnosis not present

## 2020-07-25 DIAGNOSIS — I2510 Atherosclerotic heart disease of native coronary artery without angina pectoris: Secondary | ICD-10-CM | POA: Diagnosis not present

## 2020-07-25 DIAGNOSIS — F0280 Dementia in other diseases classified elsewhere without behavioral disturbance: Secondary | ICD-10-CM | POA: Diagnosis not present

## 2020-07-25 DIAGNOSIS — R2681 Unsteadiness on feet: Secondary | ICD-10-CM | POA: Diagnosis not present

## 2020-07-25 DIAGNOSIS — R262 Difficulty in walking, not elsewhere classified: Secondary | ICD-10-CM | POA: Diagnosis not present

## 2020-07-25 DIAGNOSIS — G301 Alzheimer's disease with late onset: Secondary | ICD-10-CM | POA: Diagnosis not present

## 2020-07-27 DIAGNOSIS — R0982 Postnasal drip: Secondary | ICD-10-CM | POA: Diagnosis not present

## 2020-07-27 DIAGNOSIS — R059 Cough, unspecified: Secondary | ICD-10-CM | POA: Diagnosis not present

## 2020-09-19 DIAGNOSIS — F39 Unspecified mood [affective] disorder: Secondary | ICD-10-CM | POA: Diagnosis not present

## 2020-09-19 DIAGNOSIS — M1990 Unspecified osteoarthritis, unspecified site: Secondary | ICD-10-CM | POA: Diagnosis not present

## 2020-09-19 DIAGNOSIS — K219 Gastro-esophageal reflux disease without esophagitis: Secondary | ICD-10-CM | POA: Diagnosis not present

## 2020-09-19 DIAGNOSIS — G309 Alzheimer's disease, unspecified: Secondary | ICD-10-CM | POA: Diagnosis not present

## 2020-09-19 DIAGNOSIS — I2510 Atherosclerotic heart disease of native coronary artery without angina pectoris: Secondary | ICD-10-CM | POA: Diagnosis not present

## 2020-09-19 DIAGNOSIS — E039 Hypothyroidism, unspecified: Secondary | ICD-10-CM | POA: Diagnosis not present

## 2020-09-19 DIAGNOSIS — N183 Chronic kidney disease, stage 3 unspecified: Secondary | ICD-10-CM | POA: Diagnosis not present

## 2020-09-19 DIAGNOSIS — I951 Orthostatic hypotension: Secondary | ICD-10-CM | POA: Diagnosis not present

## 2020-10-01 DIAGNOSIS — F39 Unspecified mood [affective] disorder: Secondary | ICD-10-CM | POA: Diagnosis not present

## 2020-12-10 DIAGNOSIS — F39 Unspecified mood [affective] disorder: Secondary | ICD-10-CM | POA: Diagnosis not present

## 2020-12-10 DIAGNOSIS — N1831 Chronic kidney disease, stage 3a: Secondary | ICD-10-CM | POA: Diagnosis not present

## 2020-12-10 DIAGNOSIS — I2510 Atherosclerotic heart disease of native coronary artery without angina pectoris: Secondary | ICD-10-CM | POA: Diagnosis not present

## 2020-12-10 DIAGNOSIS — G301 Alzheimer's disease with late onset: Secondary | ICD-10-CM | POA: Diagnosis not present

## 2020-12-10 DIAGNOSIS — I951 Orthostatic hypotension: Secondary | ICD-10-CM | POA: Diagnosis not present

## 2021-01-16 DIAGNOSIS — E039 Hypothyroidism, unspecified: Secondary | ICD-10-CM | POA: Diagnosis not present

## 2021-01-16 DIAGNOSIS — K219 Gastro-esophageal reflux disease without esophagitis: Secondary | ICD-10-CM | POA: Diagnosis not present

## 2021-01-16 DIAGNOSIS — M1990 Unspecified osteoarthritis, unspecified site: Secondary | ICD-10-CM | POA: Diagnosis not present

## 2021-01-16 DIAGNOSIS — G309 Alzheimer's disease, unspecified: Secondary | ICD-10-CM | POA: Diagnosis not present

## 2021-01-16 DIAGNOSIS — I2510 Atherosclerotic heart disease of native coronary artery without angina pectoris: Secondary | ICD-10-CM | POA: Diagnosis not present

## 2021-01-16 DIAGNOSIS — N183 Chronic kidney disease, stage 3 unspecified: Secondary | ICD-10-CM | POA: Diagnosis not present

## 2021-01-16 DIAGNOSIS — I951 Orthostatic hypotension: Secondary | ICD-10-CM | POA: Diagnosis not present

## 2021-01-16 DIAGNOSIS — F39 Unspecified mood [affective] disorder: Secondary | ICD-10-CM | POA: Diagnosis not present

## 2021-02-08 DIAGNOSIS — I2510 Atherosclerotic heart disease of native coronary artery without angina pectoris: Secondary | ICD-10-CM | POA: Diagnosis not present

## 2021-02-08 DIAGNOSIS — R2681 Unsteadiness on feet: Secondary | ICD-10-CM | POA: Diagnosis not present

## 2021-02-08 DIAGNOSIS — F0280 Dementia in other diseases classified elsewhere without behavioral disturbance: Secondary | ICD-10-CM | POA: Diagnosis not present

## 2021-02-08 DIAGNOSIS — R262 Difficulty in walking, not elsewhere classified: Secondary | ICD-10-CM | POA: Diagnosis not present

## 2021-02-08 DIAGNOSIS — G301 Alzheimer's disease with late onset: Secondary | ICD-10-CM | POA: Diagnosis not present

## 2021-02-08 DIAGNOSIS — G3184 Mild cognitive impairment, so stated: Secondary | ICD-10-CM | POA: Diagnosis not present

## 2021-02-12 DIAGNOSIS — I2510 Atherosclerotic heart disease of native coronary artery without angina pectoris: Secondary | ICD-10-CM | POA: Diagnosis not present

## 2021-02-12 DIAGNOSIS — F0280 Dementia in other diseases classified elsewhere without behavioral disturbance: Secondary | ICD-10-CM | POA: Diagnosis not present

## 2021-02-12 DIAGNOSIS — R2681 Unsteadiness on feet: Secondary | ICD-10-CM | POA: Diagnosis not present

## 2021-02-12 DIAGNOSIS — G3184 Mild cognitive impairment, so stated: Secondary | ICD-10-CM | POA: Diagnosis not present

## 2021-02-12 DIAGNOSIS — R262 Difficulty in walking, not elsewhere classified: Secondary | ICD-10-CM | POA: Diagnosis not present

## 2021-02-12 DIAGNOSIS — G301 Alzheimer's disease with late onset: Secondary | ICD-10-CM | POA: Diagnosis not present

## 2021-02-13 DIAGNOSIS — B351 Tinea unguium: Secondary | ICD-10-CM | POA: Diagnosis not present

## 2021-02-14 DIAGNOSIS — R2681 Unsteadiness on feet: Secondary | ICD-10-CM | POA: Diagnosis not present

## 2021-02-14 DIAGNOSIS — F0280 Dementia in other diseases classified elsewhere without behavioral disturbance: Secondary | ICD-10-CM | POA: Diagnosis not present

## 2021-02-14 DIAGNOSIS — R262 Difficulty in walking, not elsewhere classified: Secondary | ICD-10-CM | POA: Diagnosis not present

## 2021-02-14 DIAGNOSIS — I2510 Atherosclerotic heart disease of native coronary artery without angina pectoris: Secondary | ICD-10-CM | POA: Diagnosis not present

## 2021-02-14 DIAGNOSIS — G3184 Mild cognitive impairment, so stated: Secondary | ICD-10-CM | POA: Diagnosis not present

## 2021-02-14 DIAGNOSIS — G301 Alzheimer's disease with late onset: Secondary | ICD-10-CM | POA: Diagnosis not present

## 2021-02-15 DIAGNOSIS — I2510 Atherosclerotic heart disease of native coronary artery without angina pectoris: Secondary | ICD-10-CM | POA: Diagnosis not present

## 2021-02-15 DIAGNOSIS — G3184 Mild cognitive impairment, so stated: Secondary | ICD-10-CM | POA: Diagnosis not present

## 2021-02-15 DIAGNOSIS — G301 Alzheimer's disease with late onset: Secondary | ICD-10-CM | POA: Diagnosis not present

## 2021-02-15 DIAGNOSIS — F0280 Dementia in other diseases classified elsewhere without behavioral disturbance: Secondary | ICD-10-CM | POA: Diagnosis not present

## 2021-02-15 DIAGNOSIS — R2681 Unsteadiness on feet: Secondary | ICD-10-CM | POA: Diagnosis not present

## 2021-02-15 DIAGNOSIS — R262 Difficulty in walking, not elsewhere classified: Secondary | ICD-10-CM | POA: Diagnosis not present

## 2021-02-19 DIAGNOSIS — G301 Alzheimer's disease with late onset: Secondary | ICD-10-CM | POA: Diagnosis not present

## 2021-02-19 DIAGNOSIS — G3184 Mild cognitive impairment, so stated: Secondary | ICD-10-CM | POA: Diagnosis not present

## 2021-02-19 DIAGNOSIS — R262 Difficulty in walking, not elsewhere classified: Secondary | ICD-10-CM | POA: Diagnosis not present

## 2021-02-19 DIAGNOSIS — R2681 Unsteadiness on feet: Secondary | ICD-10-CM | POA: Diagnosis not present

## 2021-02-19 DIAGNOSIS — I2510 Atherosclerotic heart disease of native coronary artery without angina pectoris: Secondary | ICD-10-CM | POA: Diagnosis not present

## 2021-02-19 DIAGNOSIS — F0280 Dementia in other diseases classified elsewhere without behavioral disturbance: Secondary | ICD-10-CM | POA: Diagnosis not present

## 2021-02-21 DIAGNOSIS — F0280 Dementia in other diseases classified elsewhere without behavioral disturbance: Secondary | ICD-10-CM | POA: Diagnosis not present

## 2021-02-21 DIAGNOSIS — I2510 Atherosclerotic heart disease of native coronary artery without angina pectoris: Secondary | ICD-10-CM | POA: Diagnosis not present

## 2021-02-21 DIAGNOSIS — R262 Difficulty in walking, not elsewhere classified: Secondary | ICD-10-CM | POA: Diagnosis not present

## 2021-02-21 DIAGNOSIS — R2681 Unsteadiness on feet: Secondary | ICD-10-CM | POA: Diagnosis not present

## 2021-02-21 DIAGNOSIS — G3184 Mild cognitive impairment, so stated: Secondary | ICD-10-CM | POA: Diagnosis not present

## 2021-02-21 DIAGNOSIS — G301 Alzheimer's disease with late onset: Secondary | ICD-10-CM | POA: Diagnosis not present

## 2021-02-22 DIAGNOSIS — R2681 Unsteadiness on feet: Secondary | ICD-10-CM | POA: Diagnosis not present

## 2021-02-22 DIAGNOSIS — G301 Alzheimer's disease with late onset: Secondary | ICD-10-CM | POA: Diagnosis not present

## 2021-02-22 DIAGNOSIS — F0280 Dementia in other diseases classified elsewhere without behavioral disturbance: Secondary | ICD-10-CM | POA: Diagnosis not present

## 2021-02-22 DIAGNOSIS — G3184 Mild cognitive impairment, so stated: Secondary | ICD-10-CM | POA: Diagnosis not present

## 2021-02-22 DIAGNOSIS — I2510 Atherosclerotic heart disease of native coronary artery without angina pectoris: Secondary | ICD-10-CM | POA: Diagnosis not present

## 2021-02-22 DIAGNOSIS — R262 Difficulty in walking, not elsewhere classified: Secondary | ICD-10-CM | POA: Diagnosis not present

## 2021-02-25 DIAGNOSIS — R2681 Unsteadiness on feet: Secondary | ICD-10-CM | POA: Diagnosis not present

## 2021-02-25 DIAGNOSIS — R262 Difficulty in walking, not elsewhere classified: Secondary | ICD-10-CM | POA: Diagnosis not present

## 2021-02-25 DIAGNOSIS — G301 Alzheimer's disease with late onset: Secondary | ICD-10-CM | POA: Diagnosis not present

## 2021-02-25 DIAGNOSIS — I2510 Atherosclerotic heart disease of native coronary artery without angina pectoris: Secondary | ICD-10-CM | POA: Diagnosis not present

## 2021-02-25 DIAGNOSIS — F0280 Dementia in other diseases classified elsewhere without behavioral disturbance: Secondary | ICD-10-CM | POA: Diagnosis not present

## 2021-02-25 DIAGNOSIS — G3184 Mild cognitive impairment, so stated: Secondary | ICD-10-CM | POA: Diagnosis not present

## 2021-02-28 DIAGNOSIS — I2510 Atherosclerotic heart disease of native coronary artery without angina pectoris: Secondary | ICD-10-CM | POA: Diagnosis not present

## 2021-02-28 DIAGNOSIS — R262 Difficulty in walking, not elsewhere classified: Secondary | ICD-10-CM | POA: Diagnosis not present

## 2021-02-28 DIAGNOSIS — G301 Alzheimer's disease with late onset: Secondary | ICD-10-CM | POA: Diagnosis not present

## 2021-02-28 DIAGNOSIS — R2681 Unsteadiness on feet: Secondary | ICD-10-CM | POA: Diagnosis not present

## 2021-02-28 DIAGNOSIS — G3184 Mild cognitive impairment, so stated: Secondary | ICD-10-CM | POA: Diagnosis not present

## 2021-02-28 DIAGNOSIS — F0280 Dementia in other diseases classified elsewhere without behavioral disturbance: Secondary | ICD-10-CM | POA: Diagnosis not present

## 2021-03-01 DIAGNOSIS — R262 Difficulty in walking, not elsewhere classified: Secondary | ICD-10-CM | POA: Diagnosis not present

## 2021-03-01 DIAGNOSIS — I2510 Atherosclerotic heart disease of native coronary artery without angina pectoris: Secondary | ICD-10-CM | POA: Diagnosis not present

## 2021-03-01 DIAGNOSIS — R2681 Unsteadiness on feet: Secondary | ICD-10-CM | POA: Diagnosis not present

## 2021-03-01 DIAGNOSIS — F0280 Dementia in other diseases classified elsewhere without behavioral disturbance: Secondary | ICD-10-CM | POA: Diagnosis not present

## 2021-03-01 DIAGNOSIS — G301 Alzheimer's disease with late onset: Secondary | ICD-10-CM | POA: Diagnosis not present

## 2021-03-01 DIAGNOSIS — G3184 Mild cognitive impairment, so stated: Secondary | ICD-10-CM | POA: Diagnosis not present

## 2021-03-05 DIAGNOSIS — G3184 Mild cognitive impairment, so stated: Secondary | ICD-10-CM | POA: Diagnosis not present

## 2021-03-05 DIAGNOSIS — G301 Alzheimer's disease with late onset: Secondary | ICD-10-CM | POA: Diagnosis not present

## 2021-03-05 DIAGNOSIS — R262 Difficulty in walking, not elsewhere classified: Secondary | ICD-10-CM | POA: Diagnosis not present

## 2021-03-05 DIAGNOSIS — R2681 Unsteadiness on feet: Secondary | ICD-10-CM | POA: Diagnosis not present

## 2021-03-05 DIAGNOSIS — I2510 Atherosclerotic heart disease of native coronary artery without angina pectoris: Secondary | ICD-10-CM | POA: Diagnosis not present

## 2021-03-05 DIAGNOSIS — F0280 Dementia in other diseases classified elsewhere without behavioral disturbance: Secondary | ICD-10-CM | POA: Diagnosis not present

## 2021-03-07 DIAGNOSIS — G3184 Mild cognitive impairment, so stated: Secondary | ICD-10-CM | POA: Diagnosis not present

## 2021-03-07 DIAGNOSIS — R2681 Unsteadiness on feet: Secondary | ICD-10-CM | POA: Diagnosis not present

## 2021-03-07 DIAGNOSIS — R262 Difficulty in walking, not elsewhere classified: Secondary | ICD-10-CM | POA: Diagnosis not present

## 2021-03-07 DIAGNOSIS — I2510 Atherosclerotic heart disease of native coronary artery without angina pectoris: Secondary | ICD-10-CM | POA: Diagnosis not present

## 2021-03-07 DIAGNOSIS — F0280 Dementia in other diseases classified elsewhere without behavioral disturbance: Secondary | ICD-10-CM | POA: Diagnosis not present

## 2021-03-07 DIAGNOSIS — G301 Alzheimer's disease with late onset: Secondary | ICD-10-CM | POA: Diagnosis not present

## 2021-03-12 DIAGNOSIS — R262 Difficulty in walking, not elsewhere classified: Secondary | ICD-10-CM | POA: Diagnosis not present

## 2021-03-12 DIAGNOSIS — G3184 Mild cognitive impairment, so stated: Secondary | ICD-10-CM | POA: Diagnosis not present

## 2021-03-12 DIAGNOSIS — R2681 Unsteadiness on feet: Secondary | ICD-10-CM | POA: Diagnosis not present

## 2021-03-12 DIAGNOSIS — F0280 Dementia in other diseases classified elsewhere without behavioral disturbance: Secondary | ICD-10-CM | POA: Diagnosis not present

## 2021-03-12 DIAGNOSIS — G301 Alzheimer's disease with late onset: Secondary | ICD-10-CM | POA: Diagnosis not present

## 2021-03-12 DIAGNOSIS — I2510 Atherosclerotic heart disease of native coronary artery without angina pectoris: Secondary | ICD-10-CM | POA: Diagnosis not present

## 2021-03-13 DIAGNOSIS — G3184 Mild cognitive impairment, so stated: Secondary | ICD-10-CM | POA: Diagnosis not present

## 2021-03-13 DIAGNOSIS — F0280 Dementia in other diseases classified elsewhere without behavioral disturbance: Secondary | ICD-10-CM | POA: Diagnosis not present

## 2021-03-13 DIAGNOSIS — R262 Difficulty in walking, not elsewhere classified: Secondary | ICD-10-CM | POA: Diagnosis not present

## 2021-03-13 DIAGNOSIS — I2510 Atherosclerotic heart disease of native coronary artery without angina pectoris: Secondary | ICD-10-CM | POA: Diagnosis not present

## 2021-03-13 DIAGNOSIS — G301 Alzheimer's disease with late onset: Secondary | ICD-10-CM | POA: Diagnosis not present

## 2021-03-13 DIAGNOSIS — R2681 Unsteadiness on feet: Secondary | ICD-10-CM | POA: Diagnosis not present

## 2021-03-14 DIAGNOSIS — F0280 Dementia in other diseases classified elsewhere without behavioral disturbance: Secondary | ICD-10-CM | POA: Diagnosis not present

## 2021-03-14 DIAGNOSIS — G301 Alzheimer's disease with late onset: Secondary | ICD-10-CM | POA: Diagnosis not present

## 2021-03-14 DIAGNOSIS — D519 Vitamin B12 deficiency anemia, unspecified: Secondary | ICD-10-CM | POA: Diagnosis not present

## 2021-03-14 DIAGNOSIS — R2681 Unsteadiness on feet: Secondary | ICD-10-CM | POA: Diagnosis not present

## 2021-03-14 DIAGNOSIS — E039 Hypothyroidism, unspecified: Secondary | ICD-10-CM | POA: Diagnosis not present

## 2021-03-14 DIAGNOSIS — I2510 Atherosclerotic heart disease of native coronary artery without angina pectoris: Secondary | ICD-10-CM | POA: Diagnosis not present

## 2021-03-14 DIAGNOSIS — G3184 Mild cognitive impairment, so stated: Secondary | ICD-10-CM | POA: Diagnosis not present

## 2021-03-14 DIAGNOSIS — R262 Difficulty in walking, not elsewhere classified: Secondary | ICD-10-CM | POA: Diagnosis not present

## 2021-03-15 DIAGNOSIS — R2681 Unsteadiness on feet: Secondary | ICD-10-CM | POA: Diagnosis not present

## 2021-03-15 DIAGNOSIS — F0280 Dementia in other diseases classified elsewhere without behavioral disturbance: Secondary | ICD-10-CM | POA: Diagnosis not present

## 2021-03-15 DIAGNOSIS — G3184 Mild cognitive impairment, so stated: Secondary | ICD-10-CM | POA: Diagnosis not present

## 2021-03-15 DIAGNOSIS — R262 Difficulty in walking, not elsewhere classified: Secondary | ICD-10-CM | POA: Diagnosis not present

## 2021-03-15 DIAGNOSIS — I2510 Atherosclerotic heart disease of native coronary artery without angina pectoris: Secondary | ICD-10-CM | POA: Diagnosis not present

## 2021-03-15 DIAGNOSIS — G301 Alzheimer's disease with late onset: Secondary | ICD-10-CM | POA: Diagnosis not present

## 2021-03-15 LAB — BASIC METABOLIC PANEL
BUN: 9 (ref 4–21)
CO2: 26 — AB (ref 13–22)
Chloride: 104 (ref 99–108)
Creatinine: 0.7 (ref 0.5–1.1)
Glucose: 79
Potassium: 4.1 mEq/L (ref 3.5–5.1)
Sodium: 138 (ref 137–147)

## 2021-03-15 LAB — CBC: RBC: 3.76 — AB (ref 3.87–5.11)

## 2021-03-15 LAB — COMPREHENSIVE METABOLIC PANEL
Albumin: 3.8 (ref 3.5–5.0)
Calcium: 8.9 (ref 8.7–10.7)
Globulin: 2.4
eGFR: 78

## 2021-03-15 LAB — HEPATIC FUNCTION PANEL
ALT: 11 U/L (ref 7–35)
AST: 15 (ref 13–35)
Bilirubin, Total: 0.5

## 2021-03-15 LAB — CBC AND DIFFERENTIAL
HCT: 36 (ref 36–46)
Hemoglobin: 11.9 — AB (ref 12.0–16.0)
Platelets: 203 10*3/uL (ref 150–400)
WBC: 9.4

## 2021-03-15 LAB — VITAMIN B12: Vitamin B-12: 804

## 2021-03-20 DIAGNOSIS — F0280 Dementia in other diseases classified elsewhere without behavioral disturbance: Secondary | ICD-10-CM | POA: Diagnosis not present

## 2021-03-20 DIAGNOSIS — G301 Alzheimer's disease with late onset: Secondary | ICD-10-CM | POA: Diagnosis not present

## 2021-03-20 DIAGNOSIS — I2510 Atherosclerotic heart disease of native coronary artery without angina pectoris: Secondary | ICD-10-CM | POA: Diagnosis not present

## 2021-03-20 DIAGNOSIS — R2681 Unsteadiness on feet: Secondary | ICD-10-CM | POA: Diagnosis not present

## 2021-03-20 DIAGNOSIS — R262 Difficulty in walking, not elsewhere classified: Secondary | ICD-10-CM | POA: Diagnosis not present

## 2021-03-20 DIAGNOSIS — G3184 Mild cognitive impairment, so stated: Secondary | ICD-10-CM | POA: Diagnosis not present

## 2021-03-21 DIAGNOSIS — R262 Difficulty in walking, not elsewhere classified: Secondary | ICD-10-CM | POA: Diagnosis not present

## 2021-03-21 DIAGNOSIS — F321 Major depressive disorder, single episode, moderate: Secondary | ICD-10-CM | POA: Diagnosis not present

## 2021-03-21 DIAGNOSIS — I951 Orthostatic hypotension: Secondary | ICD-10-CM | POA: Diagnosis not present

## 2021-03-21 DIAGNOSIS — F0280 Dementia in other diseases classified elsewhere without behavioral disturbance: Secondary | ICD-10-CM | POA: Diagnosis not present

## 2021-03-21 DIAGNOSIS — G3184 Mild cognitive impairment, so stated: Secondary | ICD-10-CM | POA: Diagnosis not present

## 2021-03-21 DIAGNOSIS — I2510 Atherosclerotic heart disease of native coronary artery without angina pectoris: Secondary | ICD-10-CM | POA: Diagnosis not present

## 2021-03-21 DIAGNOSIS — R2681 Unsteadiness on feet: Secondary | ICD-10-CM | POA: Diagnosis not present

## 2021-03-21 DIAGNOSIS — G301 Alzheimer's disease with late onset: Secondary | ICD-10-CM | POA: Diagnosis not present

## 2021-03-22 DIAGNOSIS — R262 Difficulty in walking, not elsewhere classified: Secondary | ICD-10-CM | POA: Diagnosis not present

## 2021-03-22 DIAGNOSIS — I2510 Atherosclerotic heart disease of native coronary artery without angina pectoris: Secondary | ICD-10-CM | POA: Diagnosis not present

## 2021-03-22 DIAGNOSIS — G3184 Mild cognitive impairment, so stated: Secondary | ICD-10-CM | POA: Diagnosis not present

## 2021-03-22 DIAGNOSIS — R2681 Unsteadiness on feet: Secondary | ICD-10-CM | POA: Diagnosis not present

## 2021-03-22 DIAGNOSIS — G301 Alzheimer's disease with late onset: Secondary | ICD-10-CM | POA: Diagnosis not present

## 2021-03-22 DIAGNOSIS — F0280 Dementia in other diseases classified elsewhere without behavioral disturbance: Secondary | ICD-10-CM | POA: Diagnosis not present

## 2021-03-26 DIAGNOSIS — R2681 Unsteadiness on feet: Secondary | ICD-10-CM | POA: Diagnosis not present

## 2021-03-26 DIAGNOSIS — G3184 Mild cognitive impairment, so stated: Secondary | ICD-10-CM | POA: Diagnosis not present

## 2021-03-26 DIAGNOSIS — G301 Alzheimer's disease with late onset: Secondary | ICD-10-CM | POA: Diagnosis not present

## 2021-03-26 DIAGNOSIS — I2510 Atherosclerotic heart disease of native coronary artery without angina pectoris: Secondary | ICD-10-CM | POA: Diagnosis not present

## 2021-03-26 DIAGNOSIS — R262 Difficulty in walking, not elsewhere classified: Secondary | ICD-10-CM | POA: Diagnosis not present

## 2021-03-26 DIAGNOSIS — F0280 Dementia in other diseases classified elsewhere without behavioral disturbance: Secondary | ICD-10-CM | POA: Diagnosis not present

## 2021-03-28 DIAGNOSIS — I2510 Atherosclerotic heart disease of native coronary artery without angina pectoris: Secondary | ICD-10-CM | POA: Diagnosis not present

## 2021-03-28 DIAGNOSIS — F0280 Dementia in other diseases classified elsewhere without behavioral disturbance: Secondary | ICD-10-CM | POA: Diagnosis not present

## 2021-03-28 DIAGNOSIS — G301 Alzheimer's disease with late onset: Secondary | ICD-10-CM | POA: Diagnosis not present

## 2021-03-28 DIAGNOSIS — G3184 Mild cognitive impairment, so stated: Secondary | ICD-10-CM | POA: Diagnosis not present

## 2021-03-28 DIAGNOSIS — R262 Difficulty in walking, not elsewhere classified: Secondary | ICD-10-CM | POA: Diagnosis not present

## 2021-03-28 DIAGNOSIS — R2681 Unsteadiness on feet: Secondary | ICD-10-CM | POA: Diagnosis not present

## 2021-03-29 DIAGNOSIS — F0280 Dementia in other diseases classified elsewhere without behavioral disturbance: Secondary | ICD-10-CM | POA: Diagnosis not present

## 2021-03-29 DIAGNOSIS — R2681 Unsteadiness on feet: Secondary | ICD-10-CM | POA: Diagnosis not present

## 2021-03-29 DIAGNOSIS — G3184 Mild cognitive impairment, so stated: Secondary | ICD-10-CM | POA: Diagnosis not present

## 2021-03-29 DIAGNOSIS — I2510 Atherosclerotic heart disease of native coronary artery without angina pectoris: Secondary | ICD-10-CM | POA: Diagnosis not present

## 2021-03-29 DIAGNOSIS — G301 Alzheimer's disease with late onset: Secondary | ICD-10-CM | POA: Diagnosis not present

## 2021-03-29 DIAGNOSIS — R262 Difficulty in walking, not elsewhere classified: Secondary | ICD-10-CM | POA: Diagnosis not present

## 2021-04-02 DIAGNOSIS — R262 Difficulty in walking, not elsewhere classified: Secondary | ICD-10-CM | POA: Diagnosis not present

## 2021-04-02 DIAGNOSIS — G301 Alzheimer's disease with late onset: Secondary | ICD-10-CM | POA: Diagnosis not present

## 2021-04-02 DIAGNOSIS — G3184 Mild cognitive impairment, so stated: Secondary | ICD-10-CM | POA: Diagnosis not present

## 2021-04-02 DIAGNOSIS — I2510 Atherosclerotic heart disease of native coronary artery without angina pectoris: Secondary | ICD-10-CM | POA: Diagnosis not present

## 2021-04-02 DIAGNOSIS — R2681 Unsteadiness on feet: Secondary | ICD-10-CM | POA: Diagnosis not present

## 2021-04-02 DIAGNOSIS — F0280 Dementia in other diseases classified elsewhere without behavioral disturbance: Secondary | ICD-10-CM | POA: Diagnosis not present

## 2021-04-03 DIAGNOSIS — G301 Alzheimer's disease with late onset: Secondary | ICD-10-CM | POA: Diagnosis not present

## 2021-04-03 DIAGNOSIS — I2510 Atherosclerotic heart disease of native coronary artery without angina pectoris: Secondary | ICD-10-CM | POA: Diagnosis not present

## 2021-04-03 DIAGNOSIS — R262 Difficulty in walking, not elsewhere classified: Secondary | ICD-10-CM | POA: Diagnosis not present

## 2021-04-03 DIAGNOSIS — G3184 Mild cognitive impairment, so stated: Secondary | ICD-10-CM | POA: Diagnosis not present

## 2021-04-03 DIAGNOSIS — F0280 Dementia in other diseases classified elsewhere without behavioral disturbance: Secondary | ICD-10-CM | POA: Diagnosis not present

## 2021-04-03 DIAGNOSIS — R2681 Unsteadiness on feet: Secondary | ICD-10-CM | POA: Diagnosis not present

## 2021-04-08 DIAGNOSIS — F0280 Dementia in other diseases classified elsewhere without behavioral disturbance: Secondary | ICD-10-CM | POA: Diagnosis not present

## 2021-04-08 DIAGNOSIS — R2681 Unsteadiness on feet: Secondary | ICD-10-CM | POA: Diagnosis not present

## 2021-04-08 DIAGNOSIS — R262 Difficulty in walking, not elsewhere classified: Secondary | ICD-10-CM | POA: Diagnosis not present

## 2021-04-08 DIAGNOSIS — G301 Alzheimer's disease with late onset: Secondary | ICD-10-CM | POA: Diagnosis not present

## 2021-04-08 DIAGNOSIS — I2510 Atherosclerotic heart disease of native coronary artery without angina pectoris: Secondary | ICD-10-CM | POA: Diagnosis not present

## 2021-04-08 DIAGNOSIS — G3184 Mild cognitive impairment, so stated: Secondary | ICD-10-CM | POA: Diagnosis not present

## 2021-04-11 DIAGNOSIS — G301 Alzheimer's disease with late onset: Secondary | ICD-10-CM | POA: Diagnosis not present

## 2021-04-11 DIAGNOSIS — R262 Difficulty in walking, not elsewhere classified: Secondary | ICD-10-CM | POA: Diagnosis not present

## 2021-04-11 DIAGNOSIS — F0280 Dementia in other diseases classified elsewhere without behavioral disturbance: Secondary | ICD-10-CM | POA: Diagnosis not present

## 2021-04-11 DIAGNOSIS — R2681 Unsteadiness on feet: Secondary | ICD-10-CM | POA: Diagnosis not present

## 2021-04-11 DIAGNOSIS — I2510 Atherosclerotic heart disease of native coronary artery without angina pectoris: Secondary | ICD-10-CM | POA: Diagnosis not present

## 2021-04-11 DIAGNOSIS — G3184 Mild cognitive impairment, so stated: Secondary | ICD-10-CM | POA: Diagnosis not present

## 2021-04-12 DIAGNOSIS — G301 Alzheimer's disease with late onset: Secondary | ICD-10-CM | POA: Diagnosis not present

## 2021-04-12 DIAGNOSIS — R262 Difficulty in walking, not elsewhere classified: Secondary | ICD-10-CM | POA: Diagnosis not present

## 2021-04-12 DIAGNOSIS — F0280 Dementia in other diseases classified elsewhere without behavioral disturbance: Secondary | ICD-10-CM | POA: Diagnosis not present

## 2021-04-12 DIAGNOSIS — R2681 Unsteadiness on feet: Secondary | ICD-10-CM | POA: Diagnosis not present

## 2021-04-12 DIAGNOSIS — I2510 Atherosclerotic heart disease of native coronary artery without angina pectoris: Secondary | ICD-10-CM | POA: Diagnosis not present

## 2021-04-12 DIAGNOSIS — G3184 Mild cognitive impairment, so stated: Secondary | ICD-10-CM | POA: Diagnosis not present

## 2021-04-15 DIAGNOSIS — G301 Alzheimer's disease with late onset: Secondary | ICD-10-CM | POA: Diagnosis not present

## 2021-04-15 DIAGNOSIS — R2681 Unsteadiness on feet: Secondary | ICD-10-CM | POA: Diagnosis not present

## 2021-04-15 DIAGNOSIS — G3184 Mild cognitive impairment, so stated: Secondary | ICD-10-CM | POA: Diagnosis not present

## 2021-04-15 DIAGNOSIS — I2510 Atherosclerotic heart disease of native coronary artery without angina pectoris: Secondary | ICD-10-CM | POA: Diagnosis not present

## 2021-04-15 DIAGNOSIS — R262 Difficulty in walking, not elsewhere classified: Secondary | ICD-10-CM | POA: Diagnosis not present

## 2021-04-15 DIAGNOSIS — F0280 Dementia in other diseases classified elsewhere without behavioral disturbance: Secondary | ICD-10-CM | POA: Diagnosis not present

## 2021-04-16 DIAGNOSIS — R2681 Unsteadiness on feet: Secondary | ICD-10-CM | POA: Diagnosis not present

## 2021-04-16 DIAGNOSIS — G3184 Mild cognitive impairment, so stated: Secondary | ICD-10-CM | POA: Diagnosis not present

## 2021-04-16 DIAGNOSIS — G301 Alzheimer's disease with late onset: Secondary | ICD-10-CM | POA: Diagnosis not present

## 2021-04-16 DIAGNOSIS — I2510 Atherosclerotic heart disease of native coronary artery without angina pectoris: Secondary | ICD-10-CM | POA: Diagnosis not present

## 2021-04-16 DIAGNOSIS — R262 Difficulty in walking, not elsewhere classified: Secondary | ICD-10-CM | POA: Diagnosis not present

## 2021-04-16 DIAGNOSIS — F0280 Dementia in other diseases classified elsewhere without behavioral disturbance: Secondary | ICD-10-CM | POA: Diagnosis not present

## 2021-05-15 DIAGNOSIS — M1990 Unspecified osteoarthritis, unspecified site: Secondary | ICD-10-CM

## 2021-05-15 DIAGNOSIS — G309 Alzheimer's disease, unspecified: Secondary | ICD-10-CM | POA: Diagnosis not present

## 2021-05-15 DIAGNOSIS — I2510 Atherosclerotic heart disease of native coronary artery without angina pectoris: Secondary | ICD-10-CM | POA: Diagnosis not present

## 2021-05-15 DIAGNOSIS — K219 Gastro-esophageal reflux disease without esophagitis: Secondary | ICD-10-CM

## 2021-05-15 DIAGNOSIS — E039 Hypothyroidism, unspecified: Secondary | ICD-10-CM | POA: Diagnosis not present

## 2021-05-15 DIAGNOSIS — F39 Unspecified mood [affective] disorder: Secondary | ICD-10-CM | POA: Diagnosis not present

## 2021-05-15 DIAGNOSIS — N183 Chronic kidney disease, stage 3 unspecified: Secondary | ICD-10-CM

## 2021-05-27 DIAGNOSIS — R339 Retention of urine, unspecified: Secondary | ICD-10-CM | POA: Diagnosis not present

## 2021-08-05 DIAGNOSIS — E440 Moderate protein-calorie malnutrition: Secondary | ICD-10-CM

## 2021-08-05 DIAGNOSIS — F329 Major depressive disorder, single episode, unspecified: Secondary | ICD-10-CM | POA: Diagnosis not present

## 2021-08-05 DIAGNOSIS — I951 Orthostatic hypotension: Secondary | ICD-10-CM | POA: Diagnosis not present

## 2021-08-05 DIAGNOSIS — G301 Alzheimer's disease with late onset: Secondary | ICD-10-CM | POA: Diagnosis not present

## 2021-08-05 DIAGNOSIS — I2510 Atherosclerotic heart disease of native coronary artery without angina pectoris: Secondary | ICD-10-CM | POA: Diagnosis not present

## 2021-09-11 DIAGNOSIS — G309 Alzheimer's disease, unspecified: Secondary | ICD-10-CM | POA: Diagnosis not present

## 2021-09-11 DIAGNOSIS — M1990 Unspecified osteoarthritis, unspecified site: Secondary | ICD-10-CM

## 2021-09-11 DIAGNOSIS — R339 Retention of urine, unspecified: Secondary | ICD-10-CM

## 2021-09-11 DIAGNOSIS — N183 Chronic kidney disease, stage 3 unspecified: Secondary | ICD-10-CM

## 2021-09-11 DIAGNOSIS — E039 Hypothyroidism, unspecified: Secondary | ICD-10-CM | POA: Diagnosis not present

## 2021-09-11 DIAGNOSIS — E43 Unspecified severe protein-calorie malnutrition: Secondary | ICD-10-CM

## 2021-09-11 DIAGNOSIS — F339 Major depressive disorder, recurrent, unspecified: Secondary | ICD-10-CM

## 2021-09-11 DIAGNOSIS — I951 Orthostatic hypotension: Secondary | ICD-10-CM

## 2021-09-11 DIAGNOSIS — K219 Gastro-esophageal reflux disease without esophagitis: Secondary | ICD-10-CM | POA: Diagnosis not present

## 2021-09-11 DIAGNOSIS — I2510 Atherosclerotic heart disease of native coronary artery without angina pectoris: Secondary | ICD-10-CM | POA: Diagnosis not present

## 2021-09-11 DIAGNOSIS — F331 Major depressive disorder, recurrent, moderate: Secondary | ICD-10-CM

## 2021-11-25 DIAGNOSIS — F3341 Major depressive disorder, recurrent, in partial remission: Secondary | ICD-10-CM | POA: Diagnosis not present

## 2021-11-25 DIAGNOSIS — E441 Mild protein-calorie malnutrition: Secondary | ICD-10-CM | POA: Diagnosis not present

## 2021-11-25 DIAGNOSIS — E039 Hypothyroidism, unspecified: Secondary | ICD-10-CM

## 2021-11-25 DIAGNOSIS — G301 Alzheimer's disease with late onset: Secondary | ICD-10-CM | POA: Diagnosis not present

## 2021-11-25 DIAGNOSIS — I951 Orthostatic hypotension: Secondary | ICD-10-CM | POA: Diagnosis not present

## 2022-02-06 DIAGNOSIS — I951 Orthostatic hypotension: Secondary | ICD-10-CM | POA: Diagnosis not present

## 2022-02-06 DIAGNOSIS — G301 Alzheimer's disease with late onset: Secondary | ICD-10-CM | POA: Diagnosis not present

## 2022-02-06 DIAGNOSIS — E441 Mild protein-calorie malnutrition: Secondary | ICD-10-CM | POA: Diagnosis not present

## 2022-02-06 DIAGNOSIS — F3340 Major depressive disorder, recurrent, in remission, unspecified: Secondary | ICD-10-CM | POA: Diagnosis not present

## 2022-03-13 IMAGING — DX DG CHEST 1V PORT
1 series · 1 of 1 positions shown · non-contrast
Comparison: CT chest 06/22/2013.

CLINICAL DATA: Syncope.

EXAM:
PORTABLE CHEST 1 VIEW

[chest ap]
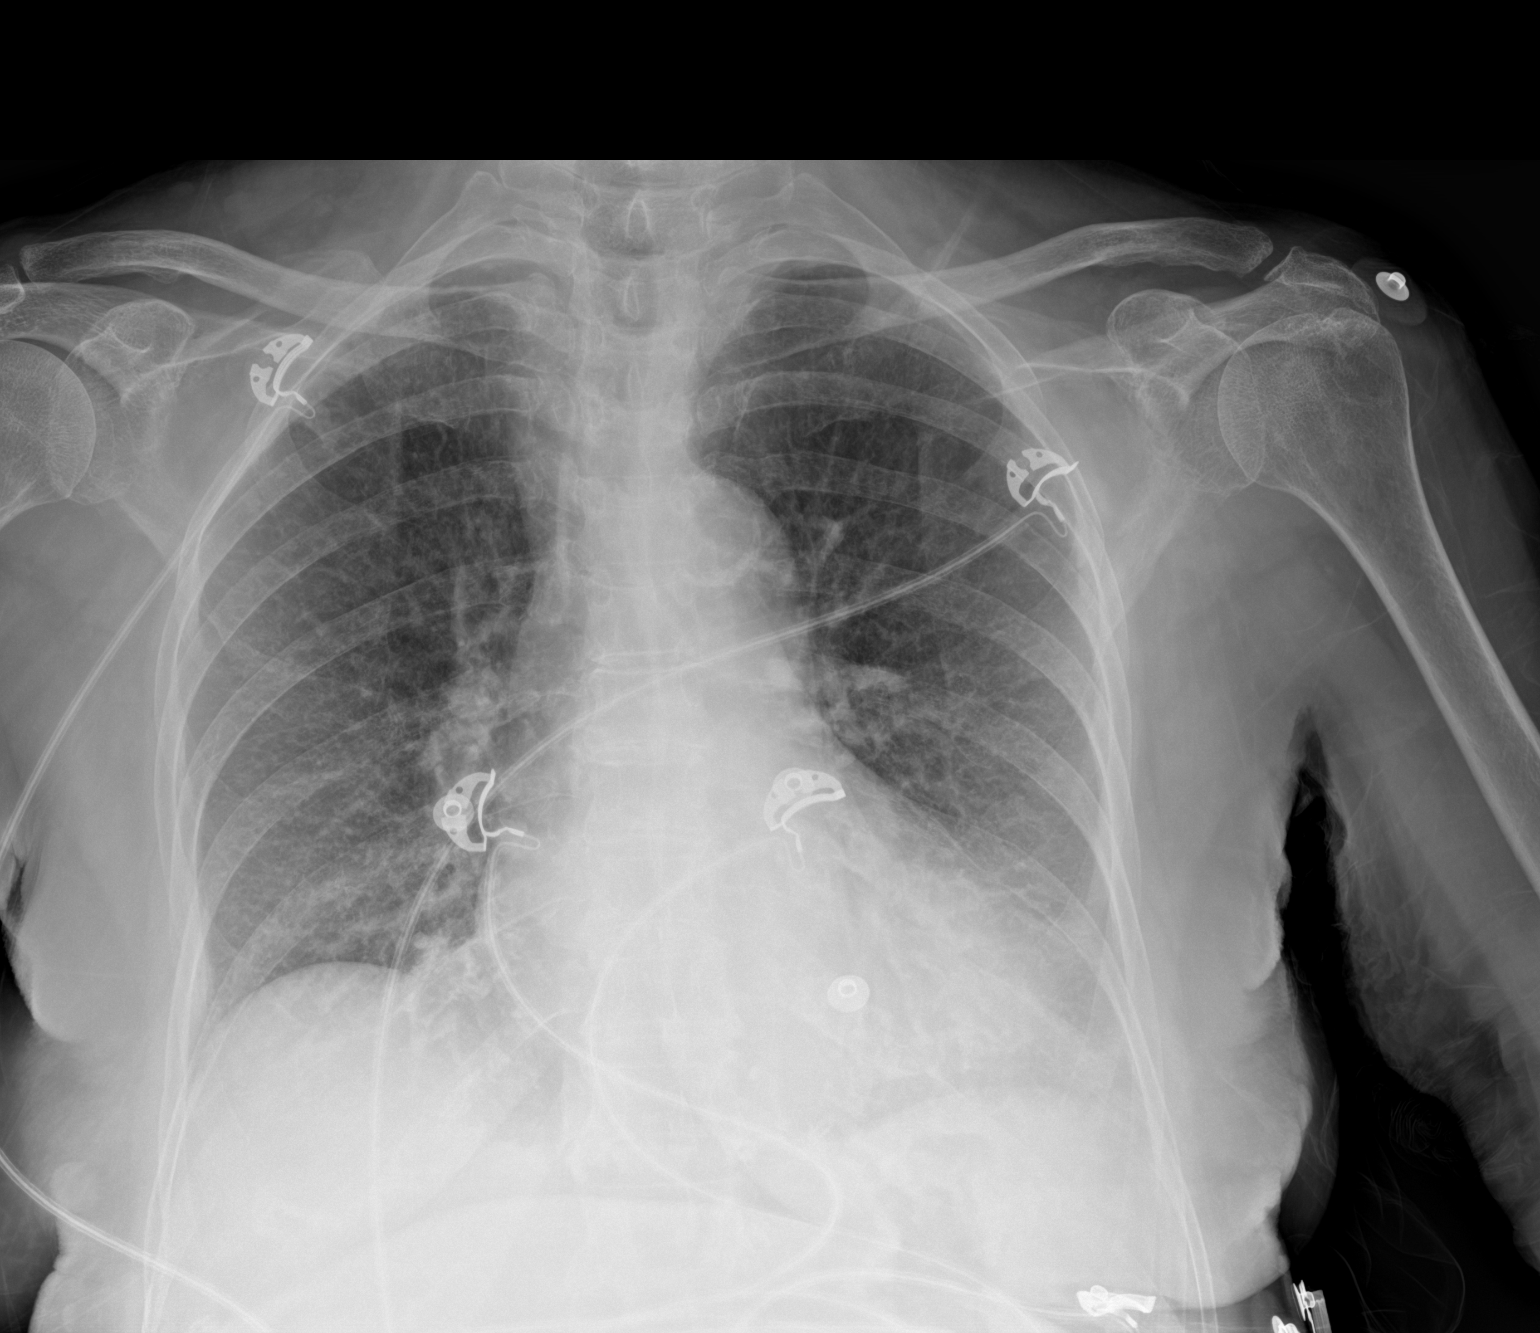

[1 of 1 positions shown; findings below may reference images not displayed]

FINDINGS: The heart size and mediastinal contours are within normal limits.
Aortic atherosclerotic calcifications noted. Asymmetric elevation of
right hemidiaphragm. Both lungs are clear. The visualized skeletal
structures are unremarkable.
IMPRESSION: No acute cardiopulmonary abnormalities.

Aortic Atherosclerosis ([id number]-NA8.8).

## 2022-03-21 LAB — BASIC METABOLIC PANEL WITH GFR
BUN: 18 (ref 4–21)
CO2: 27 — AB (ref 13–22)
Chloride: 107 (ref 99–108)
Creatinine: 0.7 (ref 0.5–1.1)
Glucose: 89
Potassium: 4.3 meq/L (ref 3.5–5.1)
Sodium: 141 (ref 137–147)

## 2022-03-21 LAB — HEPATIC FUNCTION PANEL
ALT: 11 U/L (ref 7–35)
AST: 14 (ref 13–35)
Alkaline Phosphatase: 38 (ref 25–125)
Bilirubin, Total: 0.5

## 2022-03-21 LAB — CBC AND DIFFERENTIAL
HCT: 34 — AB (ref 36–46)
Hemoglobin: 11.3 — AB (ref 12.0–16.0)
Platelets: 231 K/uL (ref 150–400)
WBC: 11.4

## 2022-03-21 LAB — COMPREHENSIVE METABOLIC PANEL
Albumin: 3.6 (ref 3.5–5.0)
Calcium: 8.8 (ref 8.7–10.7)
Globulin: 2.5
eGFR: 83

## 2022-03-21 LAB — VITAMIN B12: Vitamin B-12: 1206

## 2022-03-21 LAB — CBC: RBC: 3.63 — AB (ref 3.87–5.11)

## 2022-04-17 ENCOUNTER — Encounter: Payer: Self-pay | Admitting: Nurse Practitioner

## 2022-04-17 ENCOUNTER — Non-Acute Institutional Stay: Payer: Self-pay | Admitting: Nurse Practitioner

## 2022-04-17 DIAGNOSIS — G301 Alzheimer's disease with late onset: Secondary | ICD-10-CM

## 2022-04-17 DIAGNOSIS — E039 Hypothyroidism, unspecified: Secondary | ICD-10-CM

## 2022-04-17 DIAGNOSIS — I951 Orthostatic hypotension: Secondary | ICD-10-CM

## 2022-04-17 DIAGNOSIS — F339 Major depressive disorder, recurrent, unspecified: Secondary | ICD-10-CM

## 2022-04-17 DIAGNOSIS — F02C0 Dementia in other diseases classified elsewhere, severe, without behavioral disturbance, psychotic disturbance, mood disturbance, and anxiety: Secondary | ICD-10-CM

## 2022-04-17 DIAGNOSIS — E440 Moderate protein-calorie malnutrition: Secondary | ICD-10-CM

## 2022-04-17 NOTE — Progress Notes (Signed)
Location:  [name] Room Number: 211-A Place of Service:  ALF [phone]) Provider:  Micah Flesher, MD  Patient Care Team: Dewayne Shorter, MD as PCP - General (Family Medicine)  Extended Emergency Contact Information Primary Emergency Contact: Rhea Pink Home Phone: [phone] Relation: Daughter Secondary Emergency Contact: Tretha Sciara Mobile Phone: [phone] Relation: Daughter  Code Status:  DNR Goals of care: Advanced Directive information    04/17/2022   11:55 AM  Advanced Directives  Does Patient Have a Medical Advance Directive? Yes  Type of Paramedic of [place];Out of facility DNR (pink MOST or yellow form)  Does patient want to make changes to medical advance directive? No - Patient declined  Copy of [place] in Chart? Yes - validated most recent copy scanned in chart (See row information)  Pre-existing out of facility DNR order (yellow form or pink MOST form) Yellow form placed in chart (order not valid for inpatient use)     Chief Complaint  Patient presents with   [place] Follow-up    HPI:  Pt is a 88 y.o. female seen today for medical management of chronic diseases.  Pt is a long term resident at [facility]. She has hs of depression, dementia, hypothyroid, orthostatic hypotension, moderate malnutrition.  Her weight has been stable recently. She requires total care and staff feeds her.  Nursing without any acute concerns. She is under hospice care.    Past Medical History:  Diagnosis Date   CAD (coronary artery disease)    Family hx   Hyperlipidemia    Palpitations    Tachy, minimized with low does beta blocker   Rapid palpitations    Past Surgical History:  Procedure Laterality Date   LITHOTRIPSY     TONSILLECTOMY     VESICOVAGINAL FISTULA CLOSURE W/ TAH      Allergies  Allergen Reactions   Prednisone     REACTION: Reaction not  known   Sulfonamide Derivatives     REACTION: Reaction not known    Outpatient Encounter Medications as of 04/17/2022  Medication Sig   acetaminophen (TYLENOL) 325 MG tablet Take 650 mg by mouth 3 (three) times daily.   Alum & Mag Hydroxide-Simeth (ALMACONE PO) Take 30 mLs by mouth every 4 (four) hours as needed.   ARIPiprazole (ABILIFY) 5 MG tablet Take 5 mg by mouth daily.   Cholecalciferol (VITAMIN D3) 1.25 MG (50000 UT) CAPS Take by mouth. Give 1 capsule by mouth in the morning starting on the 20th and ending on the 20th every month for Supplement   Dextromethorphan-guaiFENesin (TUSSIN COUGH DM PO) Take 10 mLs by mouth every 4 (four) hours as needed.   escitalopram (LEXAPRO) 10 MG tablet Take 15 mg by mouth daily.   FLUDROCORTISONE ACETATE PO Take 0.1 mg by mouth daily.   levothyroxine (SYNTHROID) 50 MCG tablet Take 50 mcg by mouth daily before breakfast.   melatonin 3 MG TABS tablet Take 6 mg by mouth at bedtime.   Menthol 5 MG LOZG Use as directed in the mouth or throat. Give 1 drop orally as needed for cough may have prn   mirtazapine (REMERON) 15 MG tablet Take 15 mg by mouth at bedtime. for appetite stimulant   nystatin (MYCOSTATIN/NYSTOP) powder Apply 1 Application topically 2 (two) times daily. to right abdominal fold and groin   aspirin 81 MG EC tablet Take 1 tablet (81 mg total) by mouth at bedtime. (Patient not taking: Reported  on 04/17/2022)   metoprolol succinate (TOPROL-XL) 25 MG 24 hr tablet Take 1 tablet (25 mg total) by mouth daily. (Patient not taking: Reported on 04/17/2022)   rosuvastatin (CRESTOR) 10 MG tablet TAKE ONE TABLET AT BEDTIME (Patient not taking: Reported on 04/17/2022)   [DISCONTINUED] Calcium Carbonate-Vitamin D (CALCIUM 600 + D PO) Take by mouth daily.   [DISCONTINUED] cholecalciferol (VITAMIN D) 1000 UNITS tablet Take 1,000 Units by mouth daily.   [DISCONTINUED] CRESTOR 10 MG tablet TAKE ONE TABLET AT BEDTIME   [DISCONTINUED] levothyroxine (SYNTHROID,  LEVOTHROID) 75 MCG tablet Take 1 tablet (75 mcg total) by mouth daily.   [DISCONTINUED] metoprolol succinate (TOPROL-XL) 25 MG 24 hr tablet TAKE 1 TABLET EVERY DAY   No facility-administered encounter medications on file as of 04/17/2022.    Review of Systems   There is no immunization history on file for this patient. Pertinent  Health Maintenance Due  Topic Date Due   DEXA SCAN  Never done   INFLUENZA VACCINE  Never done      11/09/2019    8:57 AM 01/10/2020   10:15 AM 01/19/2020   12:49 PM 01/19/2020    9:29 PM  Fall Risk  Patient Fall Risk Level Moderate fall risk High fall risk Moderate fall risk Moderate fall risk   Functional Status Survey:    Vitals:   04/17/22 1128  BP: 120/64  Pulse: 68  Resp: 12  Temp: (!) 97.1 F (36.2 C)  SpO2: 97%  Weight: 121 lb 3.2 oz (55 kg)  Height: '5\' 6"'$  (1.676 m)   Body mass index is 19.56 kg/m. Physical Exam  Labs reviewed: No results for input(s): "NA", "K", "CL", "CO2", "GLUCOSE", "BUN", "CREATININE", "CALCIUM", "MG", "PHOS" in the last 8760 hours. No results for input(s): "AST", "ALT", "ALKPHOS", "BILITOT", "PROT", "ALBUMIN" in the last 8760 hours. No results for input(s): "WBC", "NEUTROABS", "HGB", "HCT", "MCV", "PLT" in the last 8760 hours. No results found for: "TSH" No results found for: "HGBA1C" Lab Results  Component Value Date   CHOL 169 04/18/2009   HDL 79 04/18/2009   LDLCALC 68 04/18/2009   TRIG 111 04/18/2009   CHOLHDL 2.1 Ratio 04/18/2009    Significant Diagnostic Results in last 30 days:  No results found.  Assessment/Plan 1. Moderate protein-calorie malnutrition ([place]) Weight has been stable with staff feeding and remeron, to continue at this time.   2. Severe late onset Alzheimer's dementia without behavioral disturbance, psychotic disturbance, mood disturbance, or anxiety (HCC) -Stable, no acute changes in cognitive or functional status, continue supportive care.   3. Episode of recurrent major  depressive disorder, unspecified depression episode severity ([place]) -stable, continues on abilify and lexapro  4. Acquired hypothyroidism -TSH at goal on synthroid 50 mcg  5. Orthostatic hypotension -stable on fludrocortisone   Cassie Harrington K. [place], [facility] [phone]

## 2022-04-29 ENCOUNTER — Encounter: Payer: Self-pay | Admitting: Nurse Practitioner

## 2022-04-29 ENCOUNTER — Non-Acute Institutional Stay: Payer: Self-pay | Admitting: Nurse Practitioner

## 2022-04-29 DIAGNOSIS — F02C0 Dementia in other diseases classified elsewhere, severe, without behavioral disturbance, psychotic disturbance, mood disturbance, and anxiety: Secondary | ICD-10-CM

## 2022-04-29 DIAGNOSIS — G301 Alzheimer's disease with late onset: Secondary | ICD-10-CM

## 2022-04-29 MED ORDER — LORAZEPAM 0.5 MG PO TABS: 0.2500 mg | ORAL_TABLET | ORAL | 0 refills | Status: AC | PRN

## 2022-04-29 MED ORDER — MORPHINE SULFATE (CONCENTRATE) 20 MG/ML PO SOLN: 5.0000 mg | ORAL | 0 refills | Status: AC | PRN

## 2022-04-29 NOTE — Progress Notes (Unsigned)
Location:  Other Nursing Home Room Number: [place] of Service:  ALF (13)  Dewayne Shorter, MD  Patient Care Team: Dewayne Shorter, MD as PCP - [facility])  Extended Emergency Contact Information Primary Emergency Contact: Rhea Pink Home Phone: [phone] Relation: Daughter Secondary Emergency Contact: Tretha Sciara Mobile Phone: [phone] Relation: Daughter  Goals of care: Advanced Directive information    04/29/2022    9:10 AM  Advanced Directives  Does Patient Have a Medical Advance Directive? Yes  Type of Advance Directive Out of facility DNR (pink MOST or yellow form)  Does patient want to make changes to medical advance directive? No - Patient declined  Pre-existing out of facility DNR order (yellow form or pink MOST form) Yellow form placed in chart (order not valid for inpatient use)     Chief Complaint  Patient presents with   Acute Visit    Change in condition    HPI:  Pt is a 88 y.o. female seen today for an acute visit for change in condition.  Pt with hx of dementia and on hospice care was sitting in main area at memory care and let out a gasp and then became unresponsive.  She was placed back into her bed to monitor. Continues to be unresponsive and resting conformable.    Past Medical History:  Diagnosis Date   CAD (coronary artery disease)    Family hx   Hyperlipidemia    Palpitations    Tachy, minimized with low does beta blocker   Rapid palpitations    Past Surgical History:  Procedure Laterality Date   LITHOTRIPSY     TONSILLECTOMY     VESICOVAGINAL FISTULA CLOSURE W/ TAH      Allergies  Allergen Reactions   Celecoxib Nausea Only   Penicillins Nausea Only   Prednisone     REACTION: Reaction not known   Sulfonamide Derivatives Other (See Comments)    Other Reaction: Not Assessed    Outpatient Encounter Medications as of 04/29/2022  Medication Sig   acetaminophen (TYLENOL) 325 MG tablet Take 650  mg by mouth 3 (three) times daily.   Alum & Mag Hydroxide-Simeth (ALMACONE PO) Take 30 mLs by mouth every 4 (four) hours as needed.   ARIPiprazole (ABILIFY) 5 MG tablet Take 5 mg by mouth daily.   Cholecalciferol (VITAMIN D3) 1.25 MG (50000 UT) CAPS Take by mouth. Give 1 capsule by mouth in the morning starting on the 20th and ending on the 20th every month for Supplement   Dextromethorphan-guaiFENesin (TUSSIN COUGH DM PO) Take 10 mLs by mouth every 4 (four) hours as needed.   escitalopram (LEXAPRO) 10 MG tablet Take 15 mg by mouth daily.   FLUDROCORTISONE ACETATE PO Take 0.1 mg by mouth daily.   levothyroxine (SYNTHROID) 50 MCG tablet Take 50 mcg by mouth daily before breakfast.   LORazepam (ATIVAN) 0.5 MG tablet Take 0.5 tablets (0.25 mg total) by mouth every 4 (four) hours as needed for anxiety.   melatonin 3 MG TABS tablet Take 6 mg by mouth at bedtime.   Menthol 5 MG LOZG Use as directed in the mouth or throat. Give 1 drop orally as needed for cough may have prn   mirtazapine (REMERON) 15 MG tablet Take 15 mg by mouth at bedtime. for appetite stimulant   morphine (ROXANOL) 20 MG/ML concentrated solution Take 0.25 mLs (5 mg total) by mouth every 4 (four) hours as needed for severe pain.   nystatin (MYCOSTATIN/NYSTOP) powder Apply 1 Application topically  2 (two) times daily. to right abdominal fold and groin   [DISCONTINUED] aspirin 81 MG EC tablet Take 1 tablet (81 mg total) by mouth at bedtime. (Patient not taking: Reported on 04/17/2022)   [DISCONTINUED] metoprolol succinate (TOPROL-XL) 25 MG 24 hr tablet Take 1 tablet (25 mg total) by mouth daily. (Patient not taking: Reported on 04/17/2022)   [DISCONTINUED] rosuvastatin (CRESTOR) 10 MG tablet TAKE ONE TABLET AT BEDTIME (Patient not taking: Reported on 04/17/2022)   No facility-administered encounter medications on file as of 04/29/2022.    Review of Systems  Unable to perform ROS: Dementia     There is no immunization history on file  for this patient. Pertinent  Health Maintenance Due  Topic Date Due   DEXA SCAN  Never done   INFLUENZA VACCINE  Never done      11/09/2019    8:57 AM 01/10/2020   10:15 AM 01/19/2020   12:49 PM 01/19/2020    9:29 PM  Fall Risk  Patient Fall Risk Level Moderate fall risk High fall risk Moderate fall risk Moderate fall risk   Functional Status Survey:    Vitals:   04/29/22 0903  BP: 109/69  Pulse: 89  Resp: 16  Temp: 97.7 F (36.5 C)  SpO2: 95%  Weight: 121 lb 3.2 oz (55 kg)  Height: '5\' 6"'$  (1.676 m)   Body mass index is 19.56 kg/m. Physical Exam Constitutional:      Comments: Unresponsive   Cardiovascular:     Rate and Rhythm: Normal rate.  Pulmonary:     Effort: Pulmonary effort is normal.  Skin:    Coloration: Skin is pale.  Neurological:     Mental Status: Mental status is at baseline.  Psychiatric:        Mood and Affect: Mood normal.     Labs reviewed: Recent Labs    03/21/22 0000  NA 141  K 4.3  CL 107  CO2 27*  BUN 18  CREATININE 0.7  CALCIUM 8.8   Recent Labs    03/21/22 0000  AST 14  ALT 11  ALKPHOS 38  ALBUMIN 3.6   Recent Labs    03/21/22 0000  WBC 11.4  HGB 11.3*  HCT 34*  PLT 231   No results found for: "TSH" No results found for: "HGBA1C" Lab Results  Component Value Date   CHOL 169 04/18/2009   HDL 79 04/18/2009   LDLCALC 68 04/18/2009   TRIG 111 04/18/2009   CHOLHDL 2.1 Ratio 04/18/2009    Significant Diagnostic Results in last 30 days:  No results found.  Assessment/Plan 1. Severe late onset Alzheimer's dementia without behavioral disturbance, psychotic disturbance, mood disturbance, or anxiety ([place]) -pt is on hospice are and goal on comfort as her dementia has progressed, family has been called and hospice plans to visit.  Will give orders to help keep patient comfortable at this time.  - morphine (ROXANOL) 20 MG/ML concentrated solution; Take 0.25 mLs (5 mg total) by mouth every 4 (four) hours as needed for  severe pain.  Dispense: 30 mL; Refill: 0 - LORazepam (ATIVAN) 0.5 MG tablet; Take 0.5 tablets (0.25 mg total) by mouth every 4 (four) hours as needed for anxiety.  Dispense: 30 tablet; Refill: 0  Zachary Lovins K. [place], [facility] [phone]

## 2022-05-12 DEATH — deceased

## 2022-05-14 IMAGING — CT CT HEAD W/O CM
4 series · 16 of 47 positions shown, 18 images · non-contrast
Comparison: No pertinent prior exams are available for comparison.

CLINICAL DATA: Headache, intracranial hemorrhage suspected; shaking
chills and aching all over.

EXAM:
CT HEAD WITHOUT CONTRAST
TECHNIQUE: Contiguous axial images were obtained from the base of the skull
through the vertex without intravenous contrast.

[Series 2: head wo · axial · 0.47mm/px · z∈[-133,-33]mm · 6 of 30 slices shown, 8 images]
[im 5/30  brain]
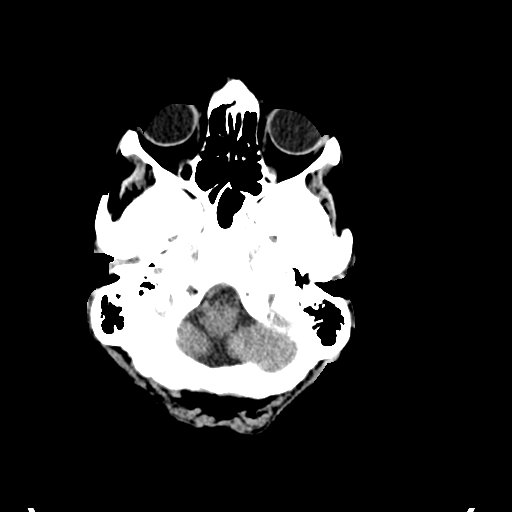
[im 5/30  bone]
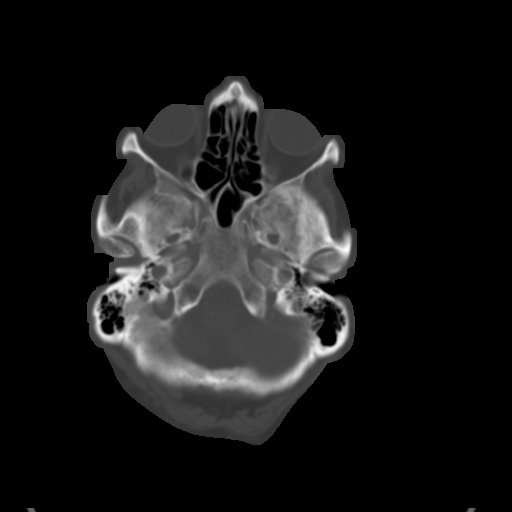
[im 9/30  brain]
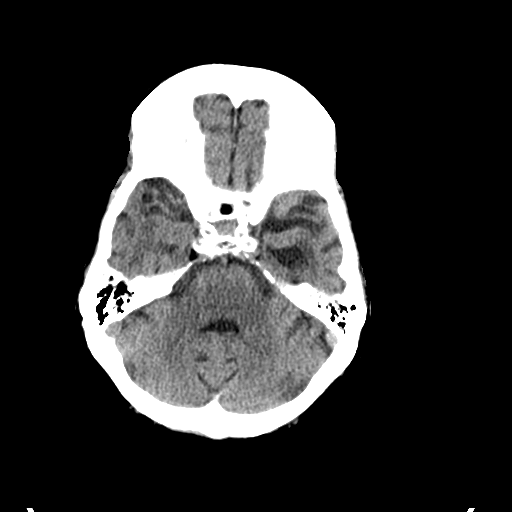
[im 13/30  brain]
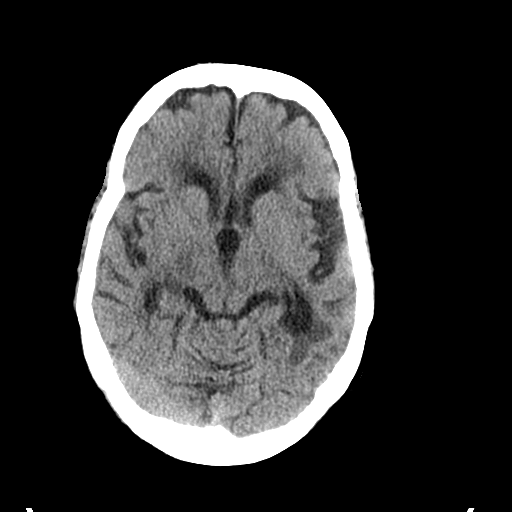
[im 17/30  brain]
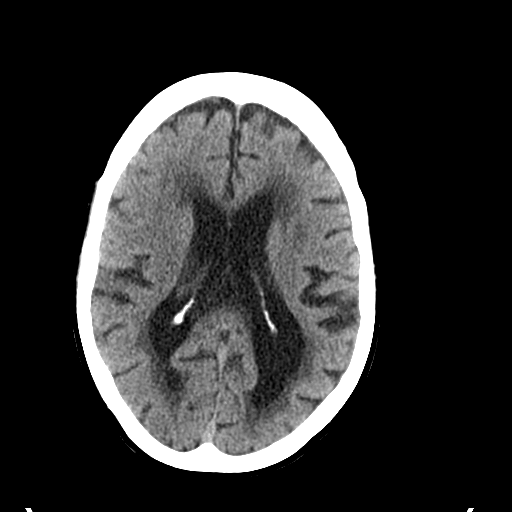
[im 21/30  brain]
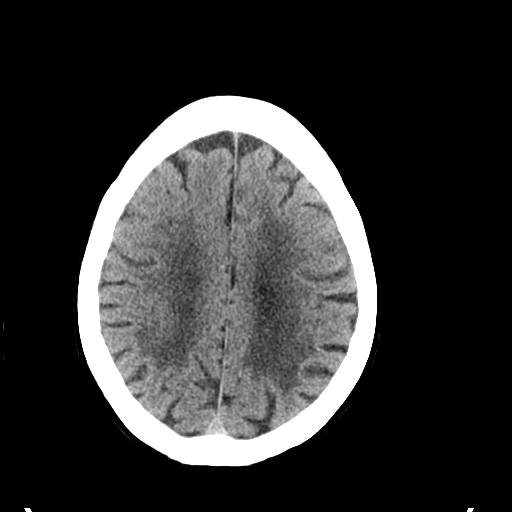
[im 21/30  bone]
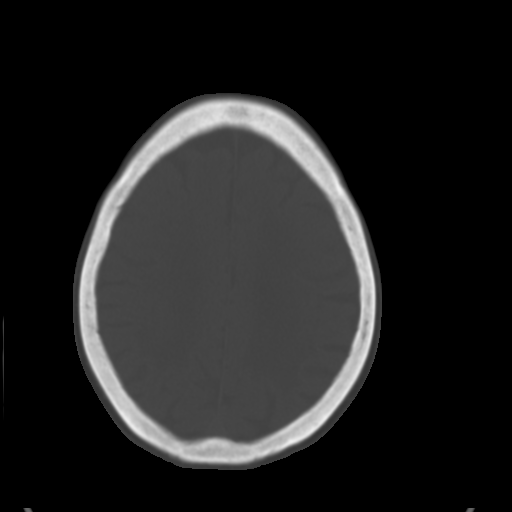
[im 25/30  brain]
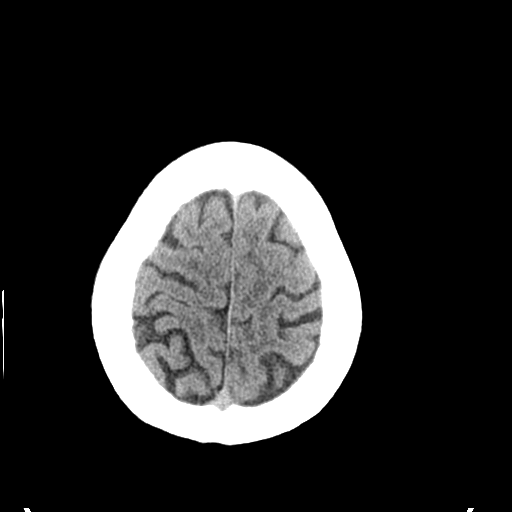

[Series 3: head bone · axial · 0.47mm/px · z∈[-139,-87]mm · 4 of 78 slices shown]
[im 8/78  bone]
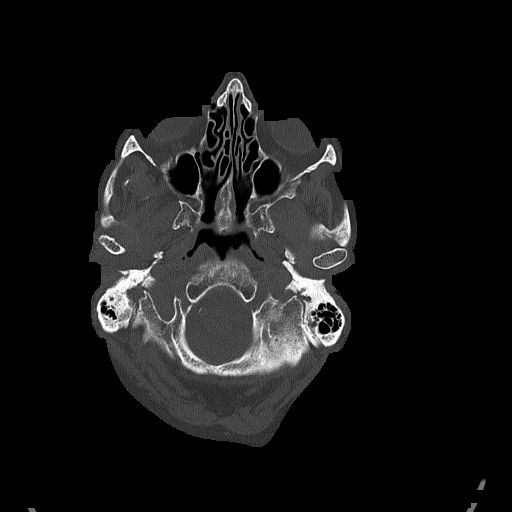
[im 15/78  bone]
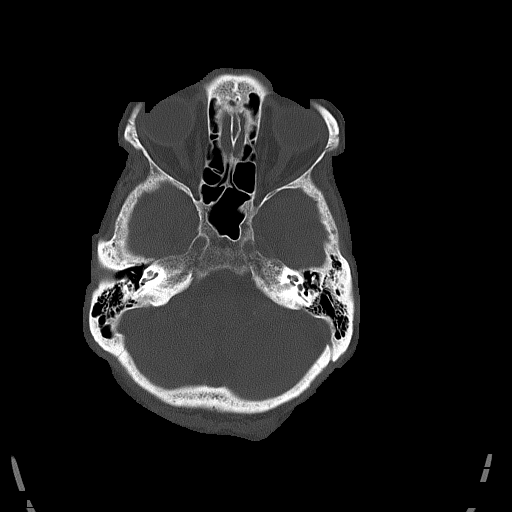
[im 26/78  bone]
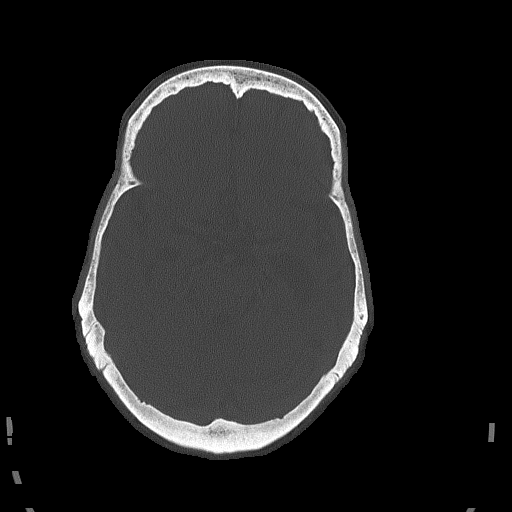
[im 34/78  bone]
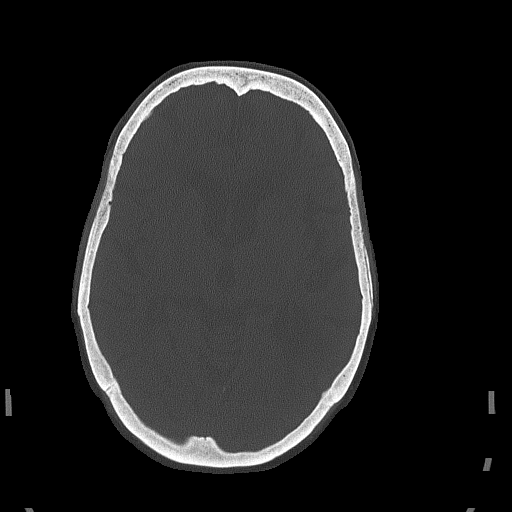

[Series 4: coronal soft tissue · coronal · 0.31mm/px · 3 of 66 slices shown]
[im 22/66  brain]
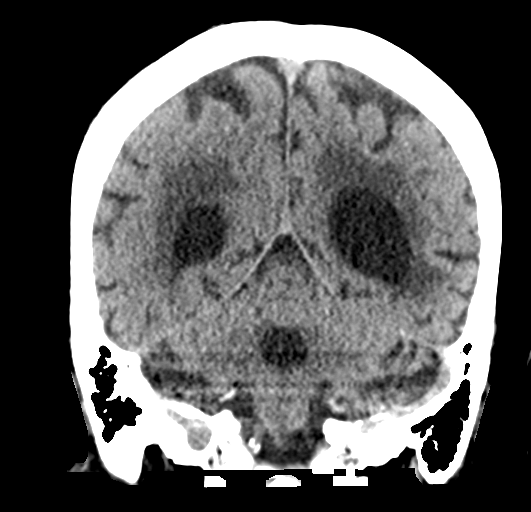
[im 29/66  brain]
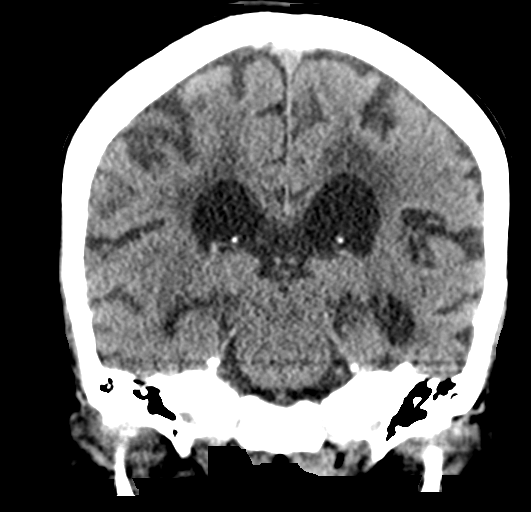
[im 37/66  brain]
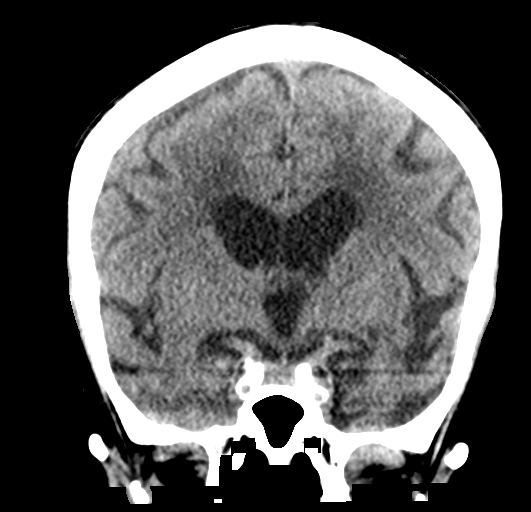

[Series 5: sagittal soft tissue · sagittal · 0.31mm/px · 3 of 56 slices shown]
[im 19/56  brain]
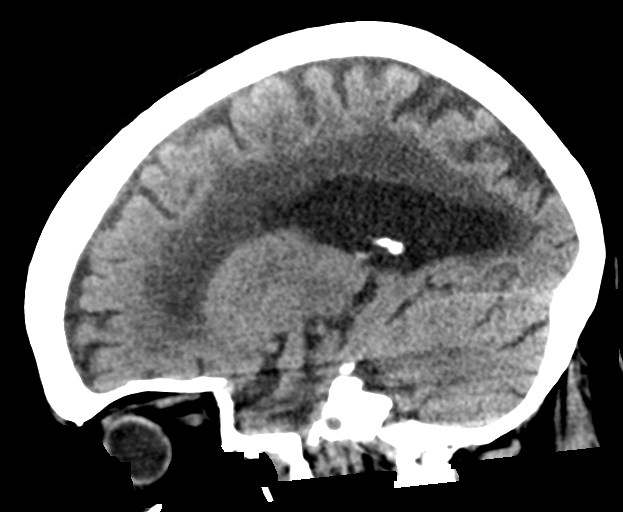
[im 28/56  brain]
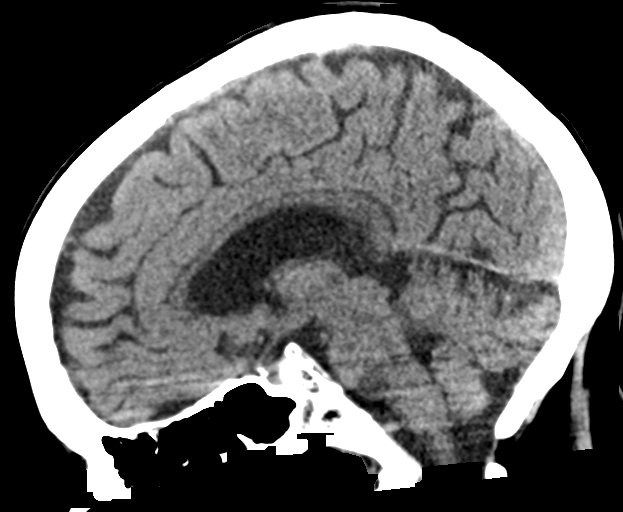
[im 37/56  brain]
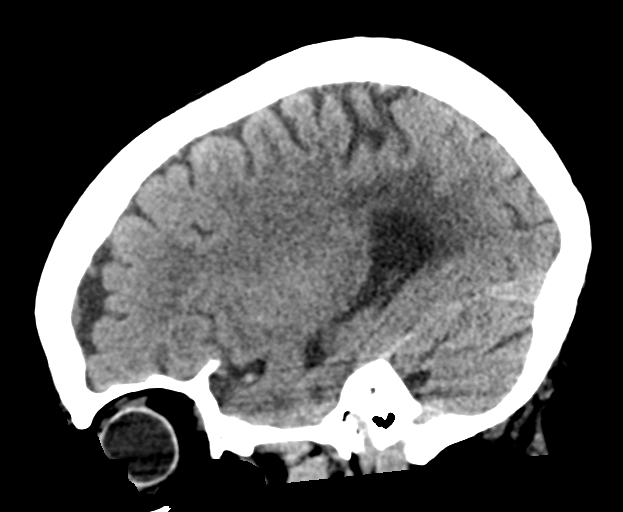

[16 of 47 positions shown; findings below may reference images not displayed]

FINDINGS: Brain:

Moderate generalized parenchymal atrophy with associated prominence
of ventricles and sulci.

Moderate ill-defined hypoattenuation within the cerebral white
matter is nonspecific, but consistent with chronic small vessel
ischemic disease.

There is no acute intracranial hemorrhage.

No demarcated cortical infarct.

No extra-axial fluid collection.

No evidence of intracranial mass.

No midline shift.

Vascular: No hyperdense vessel.  Atherosclerotic calcifications.

Skull: Normal. Negative for fracture or focal suspicious lesion.

Sinuses/Orbits: Visualized orbits show no acute finding. Trace
ethmoid sinus mucosal thickening. No significant mastoid effusion at
the imaged levels.
IMPRESSION: No CT evidence of acute intracranial abnormality.

Moderate generalized parenchymal atrophy and chronic small vessel
ischemic disease.

Minimal ethmoid sinus mucosal thickening.
